# Patient Record
Sex: Female | Born: 1990 | Hispanic: Yes | Marital: Married | Smoking: Never smoker
Health system: Southern US, Community
[De-identification: ages and names within clinical notes are randomized; demographics above are authoritative.]

## PROBLEM LIST (undated history)

## (undated) ENCOUNTER — Inpatient Hospital Stay (HOSPITAL_COMMUNITY): Payer: Self-pay

## (undated) DIAGNOSIS — Z789 Other specified health status: Secondary | ICD-10-CM

## (undated) DIAGNOSIS — R51 Headache: Secondary | ICD-10-CM

## (undated) HISTORY — PX: DILATION AND CURETTAGE OF UTERUS: SHX78

## (undated) HISTORY — DX: Headache: R51

## (undated) HISTORY — PX: OTHER SURGICAL HISTORY: SHX169

---
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
## 2013-03-21 ENCOUNTER — Ambulatory Visit (HOSPITAL_COMMUNITY): Payer: BC Managed Care – PPO | Admitting: Anesthesiology

## 2013-03-21 ENCOUNTER — Encounter (HOSPITAL_COMMUNITY)
Admission: RE | Disposition: A | Discharge status: Home / Self Care | Payer: Self-pay | Source: Ambulatory Visit | Attending: Obstetrics & Gynecology

## 2013-03-21 ENCOUNTER — Other Ambulatory Visit: Payer: Self-pay | Admitting: Obstetrics & Gynecology

## 2013-03-21 ENCOUNTER — Ambulatory Visit (HOSPITAL_COMMUNITY)
Admission: RE | Admit: 2013-03-21 | Discharge: 2013-03-21 | Disposition: A | Discharge status: Home / Self Care | Payer: BC Managed Care – PPO | Source: Ambulatory Visit | Attending: Obstetrics & Gynecology | Admitting: Obstetrics & Gynecology

## 2013-03-21 ENCOUNTER — Encounter (HOSPITAL_COMMUNITY): Payer: Self-pay | Admitting: Anesthesiology

## 2013-03-21 ENCOUNTER — Encounter (HOSPITAL_COMMUNITY): Payer: Self-pay | Admitting: *Deleted

## 2013-03-21 DIAGNOSIS — O021 Missed abortion: Secondary | ICD-10-CM

## 2013-03-21 HISTORY — PX: DILATION AND EVACUATION: SHX1459

## 2013-03-21 HISTORY — DX: Other specified health status: Z78.9

## 2013-03-21 LAB — CBC
MCH: 30.1 pg (ref 26.0–34.0)
MCHC: 35.1 g/dL (ref 30.0–36.0)
Platelets: 195 10*3/uL (ref 150–400)

## 2013-03-21 SURGERY — DILATION AND EVACUATION, UTERUS
Anesthesia: Choice | Wound class: Clean Contaminated

## 2013-03-21 MED ORDER — CHLOROPROCAINE HCL 1 % IJ SOLN
INTRAMUSCULAR | Status: DC | PRN
  Administered 2013-03-21: 20 mL

## 2013-03-21 MED ORDER — MIDAZOLAM HCL 2 MG/2ML IJ SOLN
INTRAMUSCULAR | Status: AC
  Filled 2013-03-21: qty 2

## 2013-03-21 MED ORDER — PROMETHAZINE HCL 25 MG/ML IJ SOLN: 6.2500 mg | INTRAMUSCULAR | Status: DC | PRN

## 2013-03-21 MED ORDER — PROPOFOL 10 MG/ML IV EMUL
INTRAVENOUS | Status: DC | PRN
  Administered 2013-03-21 (×4): 20 mg via INTRAVENOUS
  Administered 2013-03-21: 40 mg via INTRAVENOUS
  Administered 2013-03-21: 20 mg via INTRAVENOUS

## 2013-03-21 MED ORDER — LIDOCAINE HCL (CARDIAC) 20 MG/ML IV SOLN
INTRAVENOUS | Status: AC
  Filled 2013-03-21: qty 5

## 2013-03-21 MED ORDER — LIDOCAINE HCL (CARDIAC) 20 MG/ML IV SOLN
INTRAVENOUS | Status: DC | PRN
  Administered 2013-03-21 (×2): 20 mg via INTRAVENOUS

## 2013-03-21 MED ORDER — KETOROLAC TROMETHAMINE 30 MG/ML IJ SOLN: 15.0000 mg | Freq: Once | INTRAMUSCULAR | Status: AC | PRN

## 2013-03-21 MED ORDER — ONDANSETRON HCL 4 MG/2ML IJ SOLN
INTRAMUSCULAR | Status: AC
  Filled 2013-03-21: qty 2

## 2013-03-21 MED ORDER — FENTANYL CITRATE 0.05 MG/ML IJ SOLN: 25.0000 ug | INTRAMUSCULAR | Status: DC | PRN

## 2013-03-21 MED ORDER — FENTANYL CITRATE 0.05 MG/ML IJ SOLN
INTRAMUSCULAR | Status: DC | PRN
  Administered 2013-03-21: 50 ug via INTRAVENOUS

## 2013-03-21 MED ORDER — MEPERIDINE HCL 25 MG/ML IJ SOLN: 6.2500 mg | INTRAMUSCULAR | Status: DC | PRN

## 2013-03-21 MED ORDER — LACTATED RINGERS IV SOLN
INTRAVENOUS | Status: DC
  Administered 2013-03-21: 13:00:00 via INTRAVENOUS

## 2013-03-21 MED ORDER — METHYLERGONOVINE MALEATE 0.2 MG/ML IJ SOLN
INTRAMUSCULAR | Status: AC
  Filled 2013-03-21: qty 1

## 2013-03-21 MED ORDER — IBUPROFEN 200 MG PO TABS: 600.0000 mg | ORAL_TABLET | Freq: Three times a day (TID) | ORAL | Status: DC | PRN

## 2013-03-21 MED ORDER — KETOROLAC TROMETHAMINE 30 MG/ML IJ SOLN
INTRAMUSCULAR | Status: AC
  Filled 2013-03-21: qty 1

## 2013-03-21 MED ORDER — DOXYCYCLINE HYCLATE 100 MG IV SOLR
100.0000 mg | Freq: Once | INTRAVENOUS | Status: AC
Start: 2013-03-21 — End: 2013-03-21
  Administered 2013-03-21: 100 mg via INTRAVENOUS
  Filled 2013-03-21: qty 100

## 2013-03-21 MED ORDER — CHLOROPROCAINE HCL 1 % IJ SOLN
INTRAMUSCULAR | Status: AC
  Filled 2013-03-21: qty 30

## 2013-03-21 MED ORDER — PROPOFOL 10 MG/ML IV EMUL
INTRAVENOUS | Status: AC
  Filled 2013-03-21: qty 20

## 2013-03-21 MED ORDER — METHYLERGONOVINE MALEATE 0.2 MG/ML IJ SOLN
INTRAMUSCULAR | Status: DC | PRN
  Administered 2013-03-21: 0.2 mg via INTRAMUSCULAR

## 2013-03-21 MED ORDER — MIDAZOLAM HCL 5 MG/5ML IJ SOLN
INTRAMUSCULAR | Status: DC | PRN
  Administered 2013-03-21: 2 mg via INTRAVENOUS

## 2013-03-21 MED ORDER — MIDAZOLAM HCL 2 MG/2ML IJ SOLN: 0.5000 mg | Freq: Once | INTRAMUSCULAR | Status: AC | PRN

## 2013-03-21 MED ORDER — FENTANYL CITRATE 0.05 MG/ML IJ SOLN
INTRAMUSCULAR | Status: AC
  Filled 2013-03-21: qty 2

## 2013-03-21 SURGICAL SUPPLY — 20 items
CATH ROBINSON RED A/P 16FR (CATHETERS) ×2 IMPLANT
CLOTH BEACON ORANGE TIMEOUT ST (SAFETY) ×2 IMPLANT
DECANTER SPIKE VIAL GLASS SM (MISCELLANEOUS) ×2 IMPLANT
GLOVE BIO SURGEON STRL SZ7 (GLOVE) ×2 IMPLANT
GLOVE BIOGEL PI IND STRL 7.0 (GLOVE) ×1 IMPLANT
GLOVE BIOGEL PI INDICATOR 7.0 (GLOVE) ×1
GOWN STRL REIN XL XLG (GOWN DISPOSABLE) ×4 IMPLANT
KIT BERKELEY 1ST TRIMESTER 3/8 (MISCELLANEOUS) ×2 IMPLANT
NEEDLE SPNL 22GX3.5 QUINCKE BK (NEEDLE) ×2 IMPLANT
NS IRRIG 1000ML POUR BTL (IV SOLUTION) ×2 IMPLANT
PACK VAGINAL MINOR WOMEN LF (CUSTOM PROCEDURE TRAY) ×2 IMPLANT
PAD OB MATERNITY 4.3X12.25 (PERSONAL CARE ITEMS) ×2 IMPLANT
PAD PREP 24X48 CUFFED NSTRL (MISCELLANEOUS) ×2 IMPLANT
SET BERKELEY SUCTION TUBING (SUCTIONS) ×2 IMPLANT
SYR CONTROL 10ML LL (SYRINGE) ×2 IMPLANT
TOWEL OR 17X24 6PK STRL BLUE (TOWEL DISPOSABLE) ×4 IMPLANT
VACURETTE 10 RIGID CVD (CANNULA) IMPLANT
VACURETTE 7MM CVD STRL WRAP (CANNULA) IMPLANT
VACURETTE 8 RIGID CVD (CANNULA) IMPLANT
VACURETTE 9 RIGID CVD (CANNULA) IMPLANT
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
## 2013-03-21 NOTE — Anesthesia Preprocedure Evaluation (Signed)
Anesthesia Evaluation  Patient identified by MRN, date of birth, ID band Patient awake    Reviewed: Allergy & Precautions, H&P , Patient's Chart, lab work & pertinent test results, reviewed documented beta blocker date and time   History of Anesthesia Complications Negative for: history of anesthetic complications  Airway Mallampati: II TM Distance: >3 FB Neck ROM: full    Dental no notable dental hx.    Pulmonary neg pulmonary ROS,  breath sounds clear to auscultation  Pulmonary exam normal       Cardiovascular Exercise Tolerance: Good negative cardio ROS  Rhythm:regular Rate:Normal     Neuro/Psych negative neurological ROS  negative psych ROS   GI/Hepatic negative GI ROS, Neg liver ROS,   Endo/Other  negative endocrine ROS  Renal/GU negative Renal ROS     Musculoskeletal   Abdominal   Peds  Hematology negative hematology ROS (+)   Anesthesia Other Findings   Reproductive/Obstetrics negative OB ROS                           Anesthesia Physical Anesthesia Plan  ASA: II  Anesthesia Plan: MAC   Post-op Pain Management:    Induction:   Airway Management Planned:   Additional Equipment:   Intra-op Plan:   Post-operative Plan:   Informed Consent: I have reviewed the patients History and Physical, chart, labs and discussed the procedure including the risks, benefits and alternatives for the proposed anesthesia with the patient or authorized representative who has indicated his/her understanding and acceptance.   Dental Advisory Given  Plan Discussed with: CRNA, Surgeon and Anesthesiologist  Anesthesia Plan Comments:         Anesthesia Quick Evaluation  

## 2013-03-21 NOTE — Transfer of Care (Signed)
Immediate Anesthesia Transfer of Care Note  Patient: Cindy Bryant  Procedure(s) Performed: Procedure(s): DILATATION AND EVACUATION (N/A)  Patient Location: PACU  Anesthesia Type:MAC  Level of Consciousness: awake, alert  and oriented  Airway & Oxygen Therapy: Patient Spontanous Breathing  Post-op Assessment: Report given to PACU RN, Post -op Vital signs reviewed and stable and Patient moving all extremities  Post vital signs: Reviewed and stable  Complications: No apparent anesthesia complications

## 2013-03-21 NOTE — Op Note (Signed)
Preoperative diagnosis: 11 wks Missed abortion Postop diagnosis: as above.  Procedure: Suction D&E Anesthesia General MAC, paracervical block Surgeon: Shea Evans, MD Assistant: none  IV fluids: 800 cc LR Estimated blood loss : 50 cc Urine output: no cath Complications none Condition stable Disposition PACU  Specimen: products of conception sent to pathology.  Procedure  Indication: 11 wks pregnancy, noted absent fetal heart beat on office sonogram on 03/20/13 and measured 9 wks. Decision was made to proceed with suction D&E. Risks/ complications of surgery including infection, bleeding, damage to internal organs including uterine perforation, Asherman syndrome were reviewed. She voiced understanding and gave informed written consent.  Patient was brought to the operating room with IV running. She received preop 100mg  IV Doxycycline. She underwent general MAC anesthesia without complications. She was given dorsolithotomy position. Parts were prepped and draped in standard fashion.  Bimanual exam revealed uterus to be anteverted and 10 week size.  Speculum was placed and cervix was grasped with single-tooth tenaculum. Paracervical block with 20 cc 1% Nesacaine given.  Cervical os was dilated to 27 [place] dilator. A # 9 suction curette was introduced in the uterine cavity and suction evacuation of products of conception was done in multiple step wise fashion. Gentle sharp curettage was performed. Suction cannula was reintroduced and cavity was felt to be empty. The bleeding was controlled well and uterus was firm on exam.  At this point the procedure was complete. All instruments removed. All counts are correct x2. Specimen products of conception. No complications. Patient was made supine dorsal anesthesia and brought to the recovery room in stable condition.  Patient will be discharged home today.  Dc follow up in 2 weeks in office. Post op care and warning signs of infection and excessive  bleeding reviewed.  V.Jakobe Blau, MD.

## 2013-03-22 ENCOUNTER — Encounter (HOSPITAL_COMMUNITY): Payer: Self-pay | Admitting: Obstetrics & Gynecology

## 2013-03-22 NOTE — Anesthesia Postprocedure Evaluation (Signed)
  Anesthesia Post Note  Patient: Cindy Bryant  Procedure(s) Performed: Procedure(s) (LRB): DILATATION AND EVACUATION (N/A)  Anesthesia type: MAC  Patient location: PACU  Post pain: Pain level controlled  Post assessment: Post-op Vital signs reviewed  Last Vitals:  Filed Vitals:   03/21/13 1515  BP: 106/72  Pulse: 85  Temp:   Resp: 21    Post vital signs: Reviewed  Level of consciousness: sedated  Complications: No apparent anesthesia complications

## 2013-08-18 ENCOUNTER — Inpatient Hospital Stay (HOSPITAL_COMMUNITY): Payer: BC Managed Care – PPO

## 2013-08-18 ENCOUNTER — Inpatient Hospital Stay (HOSPITAL_COMMUNITY)
Admission: AD | Admit: 2013-08-18 | Discharge: 2013-08-18 | Disposition: A | Discharge status: Home / Self Care | Payer: BC Managed Care – PPO | Source: Ambulatory Visit | Attending: Obstetrics and Gynecology | Admitting: Obstetrics and Gynecology

## 2013-08-18 ENCOUNTER — Encounter (HOSPITAL_COMMUNITY): Payer: Self-pay | Admitting: *Deleted

## 2013-08-18 DIAGNOSIS — O200 Threatened abortion: Secondary | ICD-10-CM
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
## 2013-08-18 NOTE — MAU Note (Signed)
Pt 6.3wks G2 P0, PNC with Dr. Juliene Pina.  Having bright red vaginal bleeding this evening.  Small amt of RLQ cramping. U/S on Wednesday-no problems noted.

## 2013-08-18 NOTE — MAU Note (Addendum)
Pt. Stood up this evening and felt some discharge that was small amount of darker red bleeding that was mucousy. Has had RLQ pain throughout her pregnancy and is still experiencing this today. No cramping. Denies any strenuous activity today. Ultrasound done in office on Wednesday and everything looked fine per pt. Pt. Is wearing a pad right now scant amount of bleeding noted on pad. Did have intercourse yesterday.

## 2013-09-12 NOTE — L&D Delivery Note (Signed)
Delivery Note At 11:37 PM a viable and healthy female was delivered via Vaginal, Spontaneous Delivery (Presentation: Left Occiput Anterior).  APGAR: 9, 9; weight pending.   Placenta status: , Manual removal, inspected, complete.  Cord:  with the following complications: None.  Cord pH: na Uterine atony noted. Bimanual massage and Methergine given IM .2mg .  Anesthesia: Epidural  Episiotomy: None Lacerations: 2nd degree;Perineal Suture Repair: 2.0 3.0 vicryl rapide Est. Blood Loss (mL): 400  Mom to postpartum.  Baby to Couplet care / Skin to Skin.  Nahomy Limburg J 03/31/2014, 12:06 AM

## 2013-09-17 ENCOUNTER — Ambulatory Visit: Payer: BC Managed Care – PPO | Admitting: Cardiology

## 2013-09-25 LAB — OB RESULTS CONSOLE RUBELLA ANTIBODY, IGM: Rubella: IMMUNE

## 2013-09-25 LAB — OB RESULTS CONSOLE ANTIBODY SCREEN: Antibody Screen: NEGATIVE

## 2013-09-25 LAB — OB RESULTS CONSOLE ABO/RH: RH TYPE: POSITIVE

## 2013-09-25 LAB — OB RESULTS CONSOLE HIV ANTIBODY (ROUTINE TESTING): HIV: NONREACTIVE

## 2013-09-25 LAB — OB RESULTS CONSOLE HEPATITIS B SURFACE ANTIGEN: Hepatitis B Surface Ag: NEGATIVE

## 2013-09-25 LAB — OB RESULTS CONSOLE RPR: RPR: NONREACTIVE

## 2013-09-27 LAB — OB RESULTS CONSOLE GC/CHLAMYDIA
CHLAMYDIA, DNA PROBE: NEGATIVE
Gonorrhea: NEGATIVE

## 2013-10-02 ENCOUNTER — Encounter (INDEPENDENT_AMBULATORY_CARE_PROVIDER_SITE_OTHER): Payer: Self-pay

## 2013-10-02 ENCOUNTER — Encounter: Payer: Self-pay | Admitting: General Surgery

## 2013-10-02 ENCOUNTER — Encounter: Payer: Self-pay | Admitting: Cardiology

## 2013-10-02 ENCOUNTER — Ambulatory Visit (INDEPENDENT_AMBULATORY_CARE_PROVIDER_SITE_OTHER): Payer: BC Managed Care – PPO | Admitting: Cardiology

## 2013-10-02 VITALS — BP 102/62 | HR 105 | Ht 67.0 in | Wt 118.0 lb

## 2013-10-02 DIAGNOSIS — R002 Palpitations: Secondary | ICD-10-CM | POA: Insufficient documentation

## 2013-10-02 DIAGNOSIS — R011 Cardiac murmur, unspecified: Secondary | ICD-10-CM | POA: Insufficient documentation

## 2013-10-02 NOTE — Patient Instructions (Addendum)
Your physician recommends that you continue on your current medications as directed. Please refer to the Current Medication list given to you today.  Your physician has requested that you have an echocardiogram. Echocardiography is a painless test that uses sound waves to create images of your heart. It provides your doctor with information about the size and shape of your heart and how well your heart's chambers and valves are working. This procedure takes approximately one hour. There are no restrictions for this procedure.  Your physician has recommended that you wear a holter monitor. Holter monitors are medical devices that record the heart's electrical activity. Doctors most often use these monitors to diagnose arrhythmias. Arrhythmias are problems with the speed or rhythm of the heartbeat. The monitor is a small, portable device. You can wear one while you do your normal daily activities. This is usually used to diagnose what is causing palpitations/syncope (passing out).  Your physician has recommended that you wear an event monitor. Event monitors are medical devices that record the heart's electrical activity. Doctors most often us these monitors to diagnose arrhythmias. Arrhythmias are problems with the speed or rhythm of the heartbeat. The monitor is a small, portable device. You can wear one while you do your normal daily activities. This is usually used to diagnose what is causing palpitations/syncope (passing out).  Your physician recommends that you schedule a follow-up appointment As Needed

## 2013-10-02 NOTE — Progress Notes (Signed)
  [address] [place], [place]  [address] Phone: [phone]) [phone] Fax:  [phone]  Date:  10/02/2013   ID:  Cindy SnookMelissa Bryant, DOB December 15, 1990, MRN [id number]  PCP:  Dr. [facility]Mody with [facility]  Cardiologist:  Armanda Magicraci Yovanni Frenette, MD     History of Present Illness: Cindy Bryant is a 23 y.o. female with no prior cardiac history who presents today for evaluation of palpitations and heart mur.  She recently told her MD that when she lays down she will feel her heart beating fast.  She notices this a few times weekly.  She is [redacted] weeks pregnant.  She started having palpitations last summer around the time she had a miscarriage.  After the miscarriage her palpitations did not resolve.  She was also told that she has a heart murmur.  She denies any chest pain but sometimes feels like she has to take a deep breath in.  She denies any LE edema, dizziness or syncope.  Wt Readings from Last 3 Encounters:  10/02/13 118 lb (53.524 kg)  08/18/13 112 lb (50.803 kg)  03/21/13 114 lb (51.71 kg) (22%*, Z = -0.78)   * Growth percentiles are based on CDC 2-20 Years data.     Past Medical History  Diagnosis Date  . Medical history non-contributory   . Palpitations     Current Outpatient Prescriptions  Medication Sig Dispense Refill  . Doxylamine-Pyridoxine (DICLEGIS PO) Take 2 tablets by mouth as needed (nausea).      . Prenatal Vit-Fe Fumarate-FA (PRENATAL MULTIVITAMIN) TABS Take 1 tablet by mouth daily at 12 noon.      . progesterone (PROMETRIUM) 200 MG capsule Take 200 mg by mouth 2 (two) times daily.       No current facility-administered medications for this visit.    Allergies:   No Known Allergies  Social History:  The patient  reports that she has never smoked. She does not have any smokeless tobacco history on file. She reports that she does not drink alcohol or use illicit drugs.   Family History:  The patient's family history includes Hypertension in her maternal grandmother and  mother.   ROS:  Please see the history of present illness.      All other systems reviewed and negative.   PHYSICAL EXAM: VS:  BP 102/62  Pulse 105  Ht 5\' 7"  (1.702 m)  Wt 118 lb (53.524 kg)  BMI 18.48 kg/m2  LMP 06/26/2013 Well nourished, well developed, in no acute distress HEENT: normal Neck: no JVD Cardiac:  normal S1, S2; RRR; 2/6 SM at LUSB to LLSB Lungs:  clear to auscultation bilaterally, no wheezing, rhonchi or rales Abd: soft, nontender, no hepatomegaly Ext: no edema Skin: warm and dry Neuro:  CNs 2-12 intact, no focal abnormalities noted  EKG:  NSR to sinus tachycardia  with no ST changes HR 100-105bpm     ASSESSMENT AND PLAN:  1. Palpitations  - I will get a 24 hour Holter to assess average HR over 24 hour period of time and also an event monitor to assess for arrhythmias 2. Heart mumur  - 2D echo to assess  Followup with me PRN  Signed, Armanda Magicraci Tashira Torre, MD 10/02/2013 12:04 PM

## 2013-10-04 ENCOUNTER — Ambulatory Visit (HOSPITAL_COMMUNITY): Payer: BC Managed Care – PPO | Attending: Cardiology | Admitting: Radiology

## 2013-10-04 ENCOUNTER — Encounter: Payer: Self-pay | Admitting: Cardiology

## 2013-10-04 ENCOUNTER — Encounter (INDEPENDENT_AMBULATORY_CARE_PROVIDER_SITE_OTHER): Payer: BC Managed Care – PPO

## 2013-10-04 ENCOUNTER — Encounter: Payer: Self-pay | Admitting: Radiology

## 2013-10-04 DIAGNOSIS — O99419 Diseases of the circulatory system complicating pregnancy, unspecified trimester: Principal | ICD-10-CM

## 2013-10-04 DIAGNOSIS — R002 Palpitations: Secondary | ICD-10-CM

## 2013-10-04 DIAGNOSIS — I059 Rheumatic mitral valve disease, unspecified: Secondary | ICD-10-CM | POA: Insufficient documentation

## 2013-10-04 DIAGNOSIS — R011 Cardiac murmur, unspecified: Secondary | ICD-10-CM

## 2013-10-04 DIAGNOSIS — I2510 Atherosclerotic heart disease of native coronary artery without angina pectoris: Secondary | ICD-10-CM | POA: Insufficient documentation

## 2013-10-04 NOTE — Progress Notes (Signed)
Patient ID: Cindy Bryant, female   DOB: Feb 07, 1991, 23 y.o.   MRN: [id number] E CARDIO 24 HR HOLTER MONITOR APPLIED

## 2013-10-04 NOTE — Progress Notes (Signed)
Echocardiogram performed.  

## 2013-10-07 ENCOUNTER — Encounter (INDEPENDENT_AMBULATORY_CARE_PROVIDER_SITE_OTHER): Payer: BC Managed Care – PPO

## 2013-10-07 ENCOUNTER — Encounter: Payer: Self-pay | Admitting: Radiology

## 2013-10-07 DIAGNOSIS — R002 Palpitations: Secondary | ICD-10-CM

## 2013-10-07 NOTE — Progress Notes (Signed)
Patient ID: Cindy SnookMelissa Bryant, female   DOB: 05-14-1991, 23 y.o.   MRN: [id number] LIfewatch 30 day monitor applied

## 2013-10-09 ENCOUNTER — Telehealth: Payer: Self-pay | Admitting: Cardiology

## 2013-10-09 NOTE — Telephone Encounter (Signed)
Please let patient know that heart monitor showed normal sinus rhythm with sinus tachycardia up to 103bpm which is normal for pregnancy and occasional extra heart beats from the bottom of the heart which are benign

## 2013-10-09 NOTE — Telephone Encounter (Signed)
Pt is aware.  

## 2013-11-13 ENCOUNTER — Telehealth: Payer: Self-pay | Admitting: Cardiology

## 2013-11-13 DIAGNOSIS — R000 Tachycardia, unspecified: Secondary | ICD-10-CM

## 2013-11-13 NOTE — Telephone Encounter (Signed)
Please let patient know that heart monitor showed NSR with sinus tachycardia up to 132 bpm which corresponded with palpitations. Most of the time she was tachycardic.  Please order a 24 hour Holter monitor to assess for average HR during 24 hour period

## 2013-11-13 NOTE — Telephone Encounter (Signed)
Pt is aware and holter ordered

## 2013-11-13 NOTE — Addendum Note (Signed)
Addended by: Nita SellsORSON, Bentley Fissel L on: 11/13/2013 12:31 PM   Modules accepted: Orders

## 2013-11-19 ENCOUNTER — Encounter (INDEPENDENT_AMBULATORY_CARE_PROVIDER_SITE_OTHER): Payer: Self-pay

## 2013-11-19 ENCOUNTER — Encounter: Payer: Self-pay | Admitting: *Deleted

## 2013-11-19 DIAGNOSIS — R000 Tachycardia, unspecified: Secondary | ICD-10-CM

## 2013-11-19 NOTE — Progress Notes (Unsigned)
Patient ID: Cindy SnookMelissa Ailey, female   DOB: 12-23-1990, 23 y.o.   MRN: [id number] E-Cardio 24 hour holter monitor applied to patient.

## 2013-11-26 ENCOUNTER — Telehealth: Payer: Self-pay | Admitting: Cardiology

## 2013-11-26 NOTE — Telephone Encounter (Signed)
Pt.notified

## 2013-11-26 NOTE — Telephone Encounter (Signed)
Please let patient know that heart monitor showed NSR with sinus tachycardia up to 155bpm.  Average HR 101bpm.  Occasional PVC's

## 2014-01-07 ENCOUNTER — Encounter (HOSPITAL_COMMUNITY): Payer: Self-pay

## 2014-01-07 ENCOUNTER — Inpatient Hospital Stay (HOSPITAL_COMMUNITY)
Admission: AD | Admit: 2014-01-07 | Discharge: 2014-01-08 | Disposition: A | Discharge status: Home / Self Care | Payer: BC Managed Care – PPO | Source: Ambulatory Visit | Attending: Obstetrics & Gynecology | Admitting: Obstetrics & Gynecology

## 2014-01-07 DIAGNOSIS — R109 Unspecified abdominal pain: Secondary | ICD-10-CM | POA: Insufficient documentation

## 2014-01-07 DIAGNOSIS — O9989 Other specified diseases and conditions complicating pregnancy, childbirth and the puerperium: Principal | ICD-10-CM

## 2014-01-07 DIAGNOSIS — N949 Unspecified condition associated with female genital organs and menstrual cycle: Secondary | ICD-10-CM

## 2014-01-07 DIAGNOSIS — O99891 Other specified diseases and conditions complicating pregnancy: Secondary | ICD-10-CM | POA: Insufficient documentation

## 2014-01-07 LAB — URINE MICROSCOPIC-ADD ON

## 2014-01-07 LAB — URINALYSIS, ROUTINE W REFLEX MICROSCOPIC
Bilirubin Urine: NEGATIVE
Glucose, UA: NEGATIVE mg/dL
HGB URINE DIPSTICK: NEGATIVE
Ketones, ur: NEGATIVE mg/dL
NITRITE: NEGATIVE
PROTEIN: NEGATIVE mg/dL
UROBILINOGEN UA: 0.2 mg/dL (ref 0.0–1.0)
pH: 5.5 (ref 5.0–8.0)

## 2014-01-07 NOTE — MAU Note (Signed)
Pt states at 1045pm she got out of the shower and noticed a large gush of fluid. States she has had some upper abdominal tightening and pain. Denies vaginal bleeding. +FM.

## 2014-01-08 LAB — AMNISURE RUPTURE OF MEMBRANE (ROM) NOT AT ARMC: AMNISURE: NEGATIVE

## 2014-01-08 LAB — POCT FERN TEST: POCT Fern Test: NEGATIVE

## 2014-01-08 MED ORDER — ACETAMINOPHEN 500 MG PO TABS
1000.0000 mg | ORAL_TABLET | Freq: Once | ORAL | Status: AC
  Administered 2014-01-08: 1000 mg via ORAL
  Filled 2014-01-08: qty 2

## 2014-01-08 NOTE — Discharge Instructions (Signed)
Abdominal Pain During Pregnancy  Belly (abdominal) pain is common during pregnancy. Most of the time, it is not a serious problem. Other times, it can be a sign that something is wrong with the pregnancy. Always tell your doctor if you have belly pain.  HOME CARE  Monitor your belly pain for any changes. The following actions may help you feel better:  · Do not have sex (intercourse) or put anything in your vagina until you feel better.  · Rest until your pain stops.  · Drink clear fluids if you feel sick to your stomach (nauseous). Do not eat solid food until you feel better.  · Only take medicine as told by your doctor.  · Keep all doctor visits as told.  GET HELP RIGHT AWAY IF:   · You are bleeding, leaking fluid, or pieces of tissue come out of your vagina.  · You have more pain or cramping.  · You keep throwing up (vomiting).  · You have pain when you pee (urinate) or have blood in your pee.  · You have a fever.  · You do not feel your baby moving as much.  · You feel very weak or feel like passing out.  · You have trouble breathing, with or without belly pain.  · You have a very bad headache and belly pain.  · You have fluid leaking from your vagina and belly pain.  · You keep having watery poop (diarrhea).  · Your belly pain does not go away after resting, or the pain gets worse.  MAKE SURE YOU:   · Understand these instructions.  · Will watch your condition.  · Will get help right away if you are not doing well or get worse.  Document Released: 08/17/2009 Document Revised: 05/01/2013 Document Reviewed: 03/28/2013  [employer]® Patient Information ©2014 [employer].
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
## 2014-03-05 LAB — OB RESULTS CONSOLE GBS: GBS: NEGATIVE

## 2014-03-26 ENCOUNTER — Encounter (HOSPITAL_COMMUNITY): Payer: Self-pay | Admitting: *Deleted

## 2014-03-26 ENCOUNTER — Inpatient Hospital Stay (HOSPITAL_COMMUNITY)
Admission: AD | Admit: 2014-03-26 | Discharge: 2014-03-26 | Disposition: A | Discharge status: Home / Self Care | Payer: BC Managed Care – PPO | Source: Ambulatory Visit | Attending: Obstetrics and Gynecology | Admitting: Obstetrics and Gynecology

## 2014-03-26 DIAGNOSIS — O479 False labor, unspecified: Secondary | ICD-10-CM | POA: Insufficient documentation

## 2014-03-26 NOTE — MAU Note (Signed)
Pt seen in office today-membranes stripped. Contractions started about 1-2 hours ago every 5 mins. Denies LOF. Has brown discharge. +FM.

## 2014-03-26 NOTE — Discharge Instructions (Signed)
Third Trimester of Pregnancy °The third trimester is from week 29 through week 42, months 7 through 9. The third trimester is a time when the fetus is growing rapidly. At the end of the ninth month, the fetus is about 20 inches in length and weighs 6-10 pounds.  °BODY CHANGES °Your body goes through many changes during pregnancy. The changes vary from woman to woman.  °· Your weight will continue to increase. You can expect to gain 25-35 pounds (11-16 kg) by the end of the pregnancy. °· You may begin to get stretch marks on your hips, abdomen, and breasts. °· You may urinate more often because the fetus is moving lower into your pelvis and pressing on your bladder. °· You may develop or continue to have heartburn as a result of your pregnancy. °· You may develop constipation because certain hormones are causing the muscles that push waste through your intestines to slow down. °· You may develop hemorrhoids or swollen, bulging veins (varicose veins). °· You may have pelvic pain because of the weight gain and pregnancy hormones relaxing your joints between the bones in your pelvis. Backaches may result from overexertion of the muscles supporting your posture. °· You may have changes in your hair. These can include thickening of your hair, rapid growth, and changes in texture. Some women also have hair loss during or after pregnancy, or hair that feels dry or thin. Your hair will most likely return to normal after your baby is born. °· Your breasts will continue to grow and be tender. A yellow discharge may leak from your breasts called colostrum. °· Your belly button may stick out. °· You may feel short of breath because of your expanding uterus. °· You may notice the fetus "dropping," or moving lower in your abdomen. °· You may have a bloody mucus discharge. This usually occurs a few days to a week before labor begins. °· Your cervix becomes thin and soft (effaced) near your due date. °WHAT TO EXPECT AT YOUR PRENATAL  EXAMS  °You will have prenatal exams every 2 weeks until week 36. Then, you will have weekly prenatal exams. During a routine prenatal visit: °· You will be weighed to make sure you and the fetus are growing normally. °· Your blood pressure is taken. °· Your abdomen will be measured to track your baby's growth. °· The fetal heartbeat will be listened to. °· Any test results from the previous visit will be discussed. °· You may have a cervical check near your due date to see if you have effaced. °At around 36 weeks, your caregiver will check your cervix. At the same time, your caregiver will also perform a test on the secretions of the vaginal tissue. This test is to determine if a type of bacteria, Group B streptococcus, is present. Your caregiver will explain this further. °Your caregiver may ask you: °· What your birth plan is. °· How you are feeling. °· If you are feeling the baby move. °· If you have had any abnormal symptoms, such as leaking fluid, bleeding, severe headaches, or abdominal cramping. °· If you have any questions. °Other tests or screenings that may be performed during your third trimester include: °· Blood tests that check for low iron levels (anemia). °· Fetal testing to check the health, activity level, and growth of the fetus. Testing is done if you have certain medical conditions or if there are problems during the pregnancy. °FALSE LABOR °You may feel small, irregular contractions that   eventually go away. These are called Braxton Hicks contractions, or false labor. Contractions may last for hours, days, or even weeks before true labor sets in. If contractions come at regular intervals, intensify, or become painful, it is best to be seen by your caregiver.  °SIGNS OF LABOR  °· Menstrual-like cramps. °· Contractions that are 5 minutes apart or less. °· Contractions that start on the top of the uterus and spread down to the lower abdomen and back. °· A sense of increased pelvic pressure or back  pain. °· A watery or bloody mucus discharge that comes from the vagina. °If you have any of these signs before the 37th week of pregnancy, call your caregiver right away. You need to go to the hospital to get checked immediately. °HOME CARE INSTRUCTIONS  °· Avoid all smoking, herbs, alcohol, and unprescribed drugs. These chemicals affect the formation and growth of the baby. °· Follow your caregiver's instructions regarding medicine use. There are medicines that are either safe or unsafe to take during pregnancy. °· Exercise only as directed by your caregiver. Experiencing uterine cramps is a good sign to stop exercising. °· Continue to eat regular, healthy meals. °· Wear a good support bra for breast tenderness. °· Do not use hot tubs, steam rooms, or saunas. °· Wear your seat belt at all times when driving. °· Avoid raw meat, uncooked cheese, cat litter boxes, and soil used by cats. These carry germs that can cause birth defects in the baby. °· Take your prenatal vitamins. °· Try taking a stool softener (if your caregiver approves) if you develop constipation. Eat more high-fiber foods, such as fresh vegetables or fruit and whole grains. Drink plenty of fluids to keep your urine clear or pale yellow. °· Take warm sitz baths to soothe any pain or discomfort caused by hemorrhoids. Use hemorrhoid cream if your caregiver approves. °· If you develop varicose veins, wear support hose. Elevate your feet for 15 minutes, 3-4 times a day. Limit salt in your diet. °· Avoid heavy lifting, wear low heal shoes, and practice good posture. °· Rest a lot with your legs elevated if you have leg cramps or low back pain. °· Visit your dentist if you have not gone during your pregnancy. Use a soft toothbrush to brush your teeth and be gentle when you floss. °· A sexual relationship may be continued unless your caregiver directs you otherwise. °· Do not travel far distances unless it is absolutely necessary and only with the approval  of your caregiver. °· Take prenatal classes to understand, practice, and ask questions about the labor and delivery. °· Make a trial run to the hospital. °· Pack your hospital bag. °· Prepare the baby's nursery. °· Continue to go to all your prenatal visits as directed by your caregiver. °SEEK MEDICAL CARE IF: °· You are unsure if you are in labor or if your water has broken. °· You have dizziness. °· You have mild pelvic cramps, pelvic pressure, or nagging pain in your abdominal area. °· You have persistent nausea, vomiting, or diarrhea. °· You have a bad smelling vaginal discharge. °· You have pain with urination. °SEEK IMMEDIATE MEDICAL CARE IF:  °· You have a fever. °· You are leaking fluid from your vagina. °· You have spotting or bleeding from your vagina. °· You have severe abdominal cramping or pain. °· You have rapid weight loss or gain. °· You have shortness of breath with chest pain. °· You notice sudden or extreme swelling   of your face, hands, ankles, feet, or legs. °· You have not felt your baby move in over an hour. °· You have severe headaches that do not go away with medicine. °· You have vision changes. °Document Released: 08/23/2001 Document Revised: 09/03/2013 Document Reviewed: 10/30/2012 °[employer]® Patient Information ©2015 [employer]. This information is not intended to replace advice given to you by your health care provider. Make sure you discuss any questions you have with your health care provider. °Fetal Movement Counts °Patient Name: __________________________________________________ Patient Due Date: ____________________ °Performing a fetal movement count is highly recommended in high-risk pregnancies, but it is good for every pregnant woman to do. Your caregiver may ask you to start counting fetal movements at 28 weeks of the pregnancy. Fetal movements often increase: °· After eating a full meal. °· After physical activity. °· After eating or drinking something sweet or cold. °· At  rest. °Pay attention to when you feel the baby is most active. This will help you notice a pattern of your baby's sleep and wake cycles and what factors contribute to an increase in fetal movement. It is important to perform a fetal movement count at the same time each day when your baby is normally most active.  °HOW TO COUNT FETAL MOVEMENTS °1. Find a quiet and comfortable area to sit or lie down on your left side. Lying on your left side provides the best blood and oxygen circulation to your baby. °2. Write down the day and time on a sheet of paper or in a journal. °3. Start counting kicks, flutters, swishes, rolls, or jabs in a 2 hour period. You should feel at least 10 movements within 2 hours. °4. If you do not feel 10 movements in 2 hours, wait 2-3 hours and count again. Look for a change in the pattern or not enough counts in 2 hours. °SEEK MEDICAL CARE IF: °· You feel less than 10 counts in 2 hours, tried twice. °· There is no movement in over an hour. °· The pattern is changing or taking longer each day to reach 10 counts in 2 hours. °· You feel the baby is not moving as he or she usually does. °Date: ____________ Movements: ____________ Start time: ____________ Finish time: ____________  °Date: ____________ Movements: ____________ Start time: ____________ Finish time: ____________ °Date: ____________ Movements: ____________ Start time: ____________ Finish time: ____________ °Date: ____________ Movements: ____________ Start time: ____________ Finish time: ____________ °Date: ____________ Movements: ____________ Start time: ____________ Finish time: ____________ °Date: ____________ Movements: ____________ Start time: ____________ Finish time: ____________ °Date: ____________ Movements: ____________ Start time: ____________ Finish time: ____________ °Date: ____________ Movements: ____________ Start time: ____________ Finish time: ____________  °Date: ____________ Movements: ____________ Start time:  ____________ Finish time: ____________ °Date: ____________ Movements: ____________ Start time: ____________ Finish time: ____________ °Date: ____________ Movements: ____________ Start time: ____________ Finish time: ____________ °Date: ____________ Movements: ____________ Start time: ____________ Finish time: ____________ °Date: ____________ Movements: ____________ Start time: ____________ Finish time: ____________ °Date: ____________ Movements: ____________ Start time: ____________ Finish time: ____________ °Date: ____________ Movements: ____________ Start time: ____________ Finish time: ____________  °Date: ____________ Movements: ____________ Start time: ____________ Finish time: ____________ °Date: ____________ Movements: ____________ Start time: ____________ Finish time: ____________ °Date: ____________ Movements: ____________ Start time: ____________ Finish time: ____________ °Date: ____________ Movements: ____________ Start time: ____________ Finish time: ____________ °Date: ____________ Movements: ____________ Start time: ____________ Finish time: ____________ °Date: ____________ Movements: ____________ Start time: ____________ Finish time: ____________ °Date: ____________ Movements: ____________ Start time: ____________ Finish time: ____________  °  Date: ____________ Movements: ____________ Start time: ____________ Finish time: ____________ °Date: ____________ Movements: ____________ Start time: ____________ Finish time: ____________ °Date: ____________ Movements: ____________ Start time: ____________ Finish time: ____________ °Date: ____________ Movements: ____________ Start time: ____________ Finish time: ____________ °Date: ____________ Movements: ____________ Start time: ____________ Finish time: ____________ °Date: ____________ Movements: ____________ Start time: ____________ Finish time: ____________ °Date: ____________ Movements: ____________ Start time: ____________ Finish time: ____________  °Date:  ____________ Movements: ____________ Start time: ____________ Finish time: ____________ °Date: ____________ Movements: ____________ Start time: ____________ Finish time: ____________ °Date: ____________ Movements: ____________ Start time: ____________ Finish time: ____________ °Date: ____________ Movements: ____________ Start time: ____________ Finish time: ____________ °Date: ____________ Movements: ____________ Start time: ____________ Finish time: ____________ °Date: ____________ Movements: ____________ Start time: ____________ Finish time: ____________ °Date: ____________ Movements: ____________ Start time: ____________ Finish time: ____________  °Date: ____________ Movements: ____________ Start time: ____________ Finish time: ____________ °Date: ____________ Movements: ____________ Start time: ____________ Finish time: ____________ °Date: ____________ Movements: ____________ Start time: ____________ Finish time: ____________ °Date: ____________ Movements: ____________ Start time: ____________ Finish time: ____________ °Date: ____________ Movements: ____________ Start time: ____________ Finish time: ____________ °Date: ____________ Movements: ____________ Start time: ____________ Finish time: ____________ °Date: ____________ Movements: ____________ Start time: ____________ Finish time: ____________  °Date: ____________ Movements: ____________ Start time: ____________ Finish time: ____________ °Date: ____________ Movements: ____________ Start time: ____________ Finish time: ____________ °Date: ____________ Movements: ____________ Start time: ____________ Finish time: ____________ °Date: ____________ Movements: ____________ Start time: ____________ Finish time: ____________ °Date: ____________ Movements: ____________ Start time: ____________ Finish time: ____________ °Date: ____________ Movements: ____________ Start time: ____________ Finish time: ____________ °Date: ____________ Movements: ____________ Start  time: ____________ Finish time: ____________  °Date: ____________ Movements: ____________ Start time: ____________ Finish time: ____________ °Date: ____________ Movements: ____________ Start time: ____________ Finish time: ____________ °Date: ____________ Movements: ____________ Start time: ____________ Finish time: ____________ °Date: ____________ Movements: ____________ Start time: ____________ Finish time: ____________ °Date: ____________ Movements: ____________ Start time: ____________ Finish time: ____________ °Date: ____________ Movements: ____________ Start time: ____________ Finish time: ____________ °Document Released: 09/28/2006 Document Revised: 08/15/2012 Document Reviewed: 06/25/2012 °[employer]® Patient Information ©2015 [employer]. This information is not intended to replace advice given to you by your health care provider. Make sure you discuss any questions you have with your health care provider. ° °

## 2014-03-26 NOTE — MAU Note (Signed)
Pt states she has not had pain, pt states she has had  contractions that are not painful. Pt states she has had them for an hour a 2

## 2014-03-27 ENCOUNTER — Encounter (HOSPITAL_COMMUNITY): Payer: Self-pay | Admitting: *Deleted

## 2014-03-27 ENCOUNTER — Telehealth (HOSPITAL_COMMUNITY): Payer: Self-pay | Admitting: *Deleted

## 2014-03-27 ENCOUNTER — Other Ambulatory Visit: Payer: Self-pay | Admitting: Obstetrics and Gynecology

## 2014-03-27 NOTE — Telephone Encounter (Signed)
Preadmission screen  

## 2014-03-29 ENCOUNTER — Inpatient Hospital Stay (HOSPITAL_COMMUNITY)
Admission: RE | Admit: 2014-03-29 | Discharge: 2014-04-01 | DRG: 774 | Disposition: A | Discharge status: Home / Self Care | Payer: BC Managed Care – PPO | Source: Ambulatory Visit | Attending: Obstetrics and Gynecology | Admitting: Obstetrics and Gynecology

## 2014-03-29 ENCOUNTER — Encounter (HOSPITAL_COMMUNITY): Payer: Self-pay

## 2014-03-29 DIAGNOSIS — Z3490 Encounter for supervision of normal pregnancy, unspecified, unspecified trimester: Secondary | ICD-10-CM

## 2014-03-29 DIAGNOSIS — Z8249 Family history of ischemic heart disease and other diseases of the circulatory system: Secondary | ICD-10-CM

## 2014-03-29 DIAGNOSIS — O9903 Anemia complicating the puerperium: Secondary | ICD-10-CM | POA: Diagnosis present

## 2014-03-29 DIAGNOSIS — O4100X0 Oligohydramnios, unspecified trimester, not applicable or unspecified: Secondary | ICD-10-CM | POA: Diagnosis present

## 2014-03-29 DIAGNOSIS — O368190 Decreased fetal movements, unspecified trimester, not applicable or unspecified: Principal | ICD-10-CM | POA: Diagnosis present

## 2014-03-29 DIAGNOSIS — D62 Acute posthemorrhagic anemia: Secondary | ICD-10-CM | POA: Diagnosis present

## 2014-03-29 LAB — CBC
HEMATOCRIT: 33.6 % — AB (ref 36.0–46.0)
Hemoglobin: 11.8 g/dL — ABNORMAL LOW (ref 12.0–15.0)
MCH: 32.2 pg (ref 26.0–34.0)
MCHC: 35.1 g/dL (ref 30.0–36.0)
MCV: 91.6 fL (ref 78.0–100.0)
Platelets: 188 10*3/uL (ref 150–400)
RBC: 3.67 MIL/uL — AB (ref 3.87–5.11)
RDW: 12.4 % (ref 11.5–15.5)
WBC: 8.6 10*3/uL (ref 4.0–10.5)

## 2014-03-29 MED ORDER — MISOPROSTOL 25 MCG QUARTER TABLET
25.0000 ug | ORAL_TABLET | ORAL | Status: DC | PRN
  Administered 2014-03-29: 25 ug via VAGINAL
  Filled 2014-03-29 (×2): qty 0.25

## 2014-03-29 MED ORDER — LACTATED RINGERS IV SOLN
INTRAVENOUS | Status: DC
  Administered 2014-03-29 – 2014-03-30 (×2): via INTRAVENOUS
  Administered 2014-03-30 (×2): 125 mL/h via INTRAVENOUS

## 2014-03-29 MED ORDER — TERBUTALINE SULFATE 1 MG/ML IJ SOLN: 0.2500 mg | Freq: Once | INTRAMUSCULAR | Status: AC | PRN

## 2014-03-29 MED ORDER — LIDOCAINE HCL (PF) 1 % IJ SOLN
30.0000 mL | INTRAMUSCULAR | Status: DC | PRN
  Filled 2014-03-29: qty 30

## 2014-03-29 MED ORDER — ACETAMINOPHEN 325 MG PO TABS
650.0000 mg | ORAL_TABLET | ORAL | Status: DC | PRN
  Administered 2014-03-30: 650 mg via ORAL
  Filled 2014-03-29: qty 2

## 2014-03-29 MED ORDER — ONDANSETRON HCL 4 MG/2ML IJ SOLN
4.0000 mg | Freq: Four times a day (QID) | INTRAMUSCULAR | Status: DC | PRN
  Administered 2014-03-30 – 2014-03-31 (×2): 4 mg via INTRAVENOUS
  Filled 2014-03-29 (×2): qty 2

## 2014-03-29 MED ORDER — CITRIC ACID-SODIUM CITRATE 334-500 MG/5ML PO SOLN: 30.0000 mL | ORAL | Status: DC | PRN

## 2014-03-29 MED ORDER — FLEET ENEMA 7-19 GM/118ML RE ENEM: 1.0000 | ENEMA | Freq: Every day | RECTAL | Status: DC | PRN

## 2014-03-29 MED ORDER — OXYTOCIN 40 UNITS IN LACTATED RINGERS INFUSION - SIMPLE MED
1.0000 m[IU]/min | INTRAVENOUS | Status: DC
  Administered 2014-03-30: 2 m[IU]/min via INTRAVENOUS
  Filled 2014-03-29: qty 1000

## 2014-03-29 MED ORDER — IBUPROFEN 600 MG PO TABS
600.0000 mg | ORAL_TABLET | Freq: Four times a day (QID) | ORAL | Status: DC | PRN
  Administered 2014-03-31: 600 mg via ORAL
  Filled 2014-03-29: qty 1

## 2014-03-29 MED ORDER — LACTATED RINGERS IV SOLN: 500.0000 mL | INTRAVENOUS | Status: DC | PRN

## 2014-03-29 MED ORDER — OXYTOCIN 40 UNITS IN LACTATED RINGERS INFUSION - SIMPLE MED
62.5000 mL/h | INTRAVENOUS | Status: DC
  Administered 2014-03-31: 62.5 mL/h via INTRAVENOUS

## 2014-03-29 MED ORDER — BUTORPHANOL TARTRATE 1 MG/ML IJ SOLN: 1.0000 mg | INTRAMUSCULAR | Status: DC | PRN

## 2014-03-29 MED ORDER — OXYCODONE-ACETAMINOPHEN 5-325 MG PO TABS: 1.0000 | ORAL_TABLET | ORAL | Status: DC | PRN

## 2014-03-29 MED ORDER — ZOLPIDEM TARTRATE 5 MG PO TABS: 5.0000 mg | ORAL_TABLET | Freq: Every evening | ORAL | Status: DC | PRN

## 2014-03-29 MED ORDER — OXYTOCIN BOLUS FROM INFUSION
500.0000 mL | INTRAVENOUS | Status: DC
  Administered 2014-03-30: 500 mL via INTRAVENOUS
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
## 2014-03-30 ENCOUNTER — Encounter (HOSPITAL_COMMUNITY): Payer: BC Managed Care – PPO | Admitting: Anesthesiology

## 2014-03-30 ENCOUNTER — Inpatient Hospital Stay (HOSPITAL_COMMUNITY): Payer: BC Managed Care – PPO | Admitting: Anesthesiology

## 2014-03-30 LAB — TYPE AND SCREEN
ABO/RH(D): B POS
ANTIBODY SCREEN: NEGATIVE

## 2014-03-30 LAB — ABO/RH: ABO/RH(D): B POS

## 2014-03-30 LAB — RPR

## 2014-03-30 MED ORDER — LACTATED RINGERS IV SOLN
500.0000 mL | Freq: Once | INTRAVENOUS | Status: AC
  Administered 2014-03-30: 500 mL via INTRAVENOUS

## 2014-03-30 MED ORDER — PHENYLEPHRINE 40 MCG/ML (10ML) SYRINGE FOR IV PUSH (FOR BLOOD PRESSURE SUPPORT)
PREFILLED_SYRINGE | INTRAVENOUS | Status: AC
  Filled 2014-03-30: qty 10

## 2014-03-30 MED ORDER — PHENYLEPHRINE 40 MCG/ML (10ML) SYRINGE FOR IV PUSH (FOR BLOOD PRESSURE SUPPORT): 80.0000 ug | PREFILLED_SYRINGE | INTRAVENOUS | Status: DC | PRN

## 2014-03-30 MED ORDER — FENTANYL 2.5 MCG/ML BUPIVACAINE 1/10 % EPIDURAL INFUSION (WH - ANES)
INTRAMUSCULAR | Status: DC | PRN
  Administered 2014-03-30: 14 mL/h via EPIDURAL

## 2014-03-30 MED ORDER — FENTANYL 2.5 MCG/ML BUPIVACAINE 1/10 % EPIDURAL INFUSION (WH - ANES)
14.0000 mL/h | INTRAMUSCULAR | Status: DC | PRN
  Administered 2014-03-30: 14 mL/h via EPIDURAL
  Filled 2014-03-30: qty 125

## 2014-03-30 MED ORDER — EPHEDRINE 5 MG/ML INJ: 10.0000 mg | INTRAVENOUS | Status: DC | PRN

## 2014-03-30 MED ORDER — EPHEDRINE 5 MG/ML INJ
INTRAVENOUS | Status: AC
  Filled 2014-03-30: qty 4

## 2014-03-30 MED ORDER — FENTANYL 2.5 MCG/ML BUPIVACAINE 1/10 % EPIDURAL INFUSION (WH - ANES)
INTRAMUSCULAR | Status: AC
  Administered 2014-03-30: 13:00:00
  Filled 2014-03-30: qty 125

## 2014-03-30 MED ORDER — DIPHENHYDRAMINE HCL 50 MG/ML IJ SOLN: 12.5000 mg | INTRAMUSCULAR | Status: DC | PRN

## 2014-03-30 MED ORDER — LIDOCAINE HCL (PF) 1 % IJ SOLN
INTRAMUSCULAR | Status: DC | PRN
  Administered 2014-03-30 (×2): 4 mL

## 2014-03-30 MED ORDER — FENTANYL 2.5 MCG/ML BUPIVACAINE 1/10 % EPIDURAL INFUSION (WH - ANES): 14.0000 mL/h | INTRAMUSCULAR | Status: DC | PRN

## 2014-03-30 NOTE — Progress Notes (Signed)
Dr Billy Coastaavon notified Pitocin is at 20 mu/mn

## 2014-03-30 NOTE — Anesthesia Procedure Notes (Signed)
Epidural Patient location during procedure: OB  Staffing Anesthesiologist: Bradlee Bridgers R Performed by: anesthesiologist   Preanesthetic Checklist Completed: patient identified, pre-op evaluation, timeout performed, IV checked, risks and benefits discussed and monitors and equipment checked  Epidural Patient position: sitting Prep: site prepped and draped and DuraPrep Patient monitoring: heart rate Approach: midline Location: L3-L4 Injection technique: LOR air and LOR saline  Needle:  Needle type: Tuohy  Needle gauge: 17 G Needle length: 9 cm Needle insertion depth: 6 cm Catheter type: closed end flexible Catheter size: 19 Gauge Catheter at skin depth: 12 cm Test dose: negative  Assessment Sensory level: T8 Events: blood not aspirated, injection not painful, no injection resistance, negative IV test and no paresthesia  Additional Notes Reason for block:procedure for pain   

## 2014-03-30 NOTE — Progress Notes (Signed)
Efraim KaufmannMelissa Carlynn Purlerez is a 23 y.o. G2P0010 at 3421w0d by LMP admitted for induction of labor due to Chronic LOF and decreased FM.  Subjective: Contractions mild to moderate, good FM  Objective: BP 134/82  Pulse 88  Temp(Src) 98.3 F (36.8 C) (Oral)  Resp 20  Ht 5\' 7"  (1.702 m)  Wt 68.04 kg (150 lb)  BMI 23.49 kg/m2  LMP 06/26/2013      FHT:  FHR: 145 bpm, variability: moderate,  accelerations:  Present,  decelerations:  Absent UC:   irregular, every 2-5 minutes SVE:   Dilation: 3 Effacement (%): 70 Station: -1 Exam by:: Dr Billy Coastaavon AROM- clear Hand noted next to vertex  Labs: Lab Results  Component Value Date   WBC 8.6 03/29/2014   HGB 11.8* 03/29/2014   HCT 33.6* 03/29/2014   MCV 91.6 03/29/2014   PLT 188 03/29/2014    Assessment / Plan: Induction of labor due to decreased FM,  progressing well on pitocin  Labor: Progressing normally Preeclampsia:  no signs or symptoms of toxicity Fetal Wellbeing:  Category I Pain Control:  Labor support without medications I/D:  n/a Anticipated MOD:  NSVD  Lexi Conaty J 03/30/2014, 11:25 AM

## 2014-03-30 NOTE — Anesthesia Preprocedure Evaluation (Signed)
Anesthesia Evaluation  Patient identified by MRN, date of birth, ID band Patient awake    Reviewed: Allergy & Precautions, H&P , Patient's Chart, lab work & pertinent test results, reviewed documented beta blocker date and time   History of Anesthesia Complications Negative for: history of anesthetic complications  Airway Mallampati: II TM Distance: >3 FB Neck ROM: full    Dental no notable dental hx.    Pulmonary neg pulmonary ROS,  breath sounds clear to auscultation  Pulmonary exam normal       Cardiovascular Exercise Tolerance: Good negative cardio ROS  Rhythm:regular Rate:Normal     Neuro/Psych negative neurological ROS  negative psych ROS   GI/Hepatic negative GI ROS, Neg liver ROS,   Endo/Other  negative endocrine ROS  Renal/GU negative Renal ROS     Musculoskeletal   Abdominal   Peds  Hematology negative hematology ROS (+)   Anesthesia Other Findings   Reproductive/Obstetrics (+) Pregnancy                           Anesthesia Physical  Anesthesia Plan  ASA: II  Anesthesia Plan: Epidural   Post-op Pain Management:    Induction:   Airway Management Planned:   Additional Equipment:   Intra-op Plan:   Post-operative Plan:   Informed Consent: I have reviewed the patients History and Physical, chart, labs and discussed the procedure including the risks, benefits and alternatives for the proposed anesthesia with the patient or authorized representative who has indicated his/her understanding and acceptance.     Plan Discussed with:   Anesthesia Plan Comments:         Anesthesia Quick Evaluation

## 2014-03-30 NOTE — Progress Notes (Signed)
Cindy Bryant is a 23 y.o. G2P0010 at 6825w0d by LMP admitted for induction of labor due to Dec FM and ? Chronic LOF.  Subjective: Comfortable with epidural  Objective: BP 113/70  Pulse 86  Temp(Src) 98.1 F (36.7 C) (Oral)  Resp 20  Ht 5\' 7"  (1.702 m)  Wt 68.04 kg (150 lb)  BMI 23.49 kg/m2  LMP 06/26/2013      FHT:  FHR: 130 bpm, variability: moderate,  accelerations:  Present,  decelerations:  Absent UC:   regular, every 2-3 minutes SVE:   5-6/80/-1-0 IUPC placed without difficulty  Labs: Lab Results  Component Value Date   WBC 8.6 03/29/2014   HGB 11.8* 03/29/2014   HCT 33.6* 03/29/2014   MCV 91.6 03/29/2014   PLT 188 03/29/2014    Assessment / Plan: Induction of labor due to dec FM and chronic LOF,  progressing well on pitocin  Labor: Progressing normally Preeclampsia:  no signs or symptoms of toxicity Fetal Wellbeing:  Category I Pain Control:  Epidural I/D:  n/a Anticipated MOD:  NSVD  Cindy Bryant 03/30/2014, 4:49 PM

## 2014-03-30 NOTE — Progress Notes (Signed)
Cindy Bryant is a 23 y.o. G2P0010 at 170w0d by LMP admitted for induction of labor due to decreased FM and ? Chronic LOF.  Subjective: Occ pressure  Objective: BP 118/71  Pulse 89  Temp(Src) 97.9 F (36.6 C) (Oral)  Resp 16  Ht 5\' 7"  (1.702 m)  Wt 68.04 kg (150 lb)  BMI 23.49 kg/m2  LMP 06/26/2013      FHT:  FHR: 135 bpm, variability: moderate,  accelerations:  Present,  decelerations:  Absent UC:   regular, every 2-3 minutes SVE:   8-9/90/0/LOA, asynclitic  Labs: Lab Results  Component Value Date   WBC 8.6 03/29/2014   HGB 11.8* 03/29/2014   HCT 33.6* 03/29/2014   MCV 91.6 03/29/2014   PLT 188 03/29/2014    Assessment / Plan: Induction of labor due to dec FM,  progressing well on pitocin  Labor: Progressing normally- will reposition in exaggerated Sims Preeclampsia:  no signs or symptoms of toxicity Fetal Wellbeing:  Category I Pain Control:  Epidural I/D:  n/a Anticipated MOD:  NSVD  Cindy Bryant J 03/30/2014, 8:48 PM

## 2014-03-31 ENCOUNTER — Encounter (HOSPITAL_COMMUNITY): Payer: Self-pay

## 2014-03-31 LAB — CBC
HCT: 29.1 % — ABNORMAL LOW (ref 36.0–46.0)
HEMOGLOBIN: 10.2 g/dL — AB (ref 12.0–15.0)
MCH: 31.8 pg (ref 26.0–34.0)
MCHC: 35.1 g/dL (ref 30.0–36.0)
MCV: 90.7 fL (ref 78.0–100.0)
PLATELETS: 162 10*3/uL (ref 150–400)
RBC: 3.21 MIL/uL — AB (ref 3.87–5.11)
RDW: 12.4 % (ref 11.5–15.5)
WBC: 16.9 10*3/uL — AB (ref 4.0–10.5)

## 2014-03-31 MED ORDER — LANOLIN HYDROUS EX OINT: TOPICAL_OINTMENT | CUTANEOUS | Status: DC | PRN

## 2014-03-31 MED ORDER — SENNOSIDES-DOCUSATE SODIUM 8.6-50 MG PO TABS
2.0000 | ORAL_TABLET | ORAL | Status: DC
  Administered 2014-04-01: 2 via ORAL
  Filled 2014-03-31: qty 2

## 2014-03-31 MED ORDER — BENZOCAINE-MENTHOL 20-0.5 % EX AERO
1.0000 "application " | INHALATION_SPRAY | CUTANEOUS | Status: DC | PRN
  Administered 2014-03-31: 1 via TOPICAL
  Filled 2014-03-31: qty 56

## 2014-03-31 MED ORDER — METHYLERGONOVINE MALEATE 0.2 MG PO TABS: 0.2000 mg | ORAL_TABLET | ORAL | Status: DC | PRN

## 2014-03-31 MED ORDER — IBUPROFEN 600 MG PO TABS
600.0000 mg | ORAL_TABLET | Freq: Four times a day (QID) | ORAL | Status: DC
  Administered 2014-03-31 – 2014-04-01 (×6): 600 mg via ORAL
  Filled 2014-03-31 (×6): qty 1

## 2014-03-31 MED ORDER — ZOLPIDEM TARTRATE 5 MG PO TABS: 5.0000 mg | ORAL_TABLET | Freq: Every evening | ORAL | Status: DC | PRN

## 2014-03-31 MED ORDER — PRENATAL MULTIVITAMIN CH
1.0000 | ORAL_TABLET | Freq: Every day | ORAL | Status: DC
  Administered 2014-03-31 – 2014-04-01 (×2): 1 via ORAL
  Filled 2014-03-31 (×2): qty 1

## 2014-03-31 MED ORDER — METHYLERGONOVINE MALEATE 0.2 MG/ML IJ SOLN
0.2000 mg | Freq: Once | INTRAMUSCULAR | Status: AC
  Administered 2014-03-30: 0.2 mg via INTRAMUSCULAR

## 2014-03-31 MED ORDER — DIPHENHYDRAMINE HCL 25 MG PO CAPS: 25.0000 mg | ORAL_CAPSULE | Freq: Four times a day (QID) | ORAL | Status: DC | PRN

## 2014-03-31 MED ORDER — METHYLERGONOVINE MALEATE 0.2 MG/ML IJ SOLN: 0.2000 mg | INTRAMUSCULAR | Status: DC | PRN

## 2014-03-31 MED ORDER — DIBUCAINE 1 % RE OINT: 1.0000 "application " | TOPICAL_OINTMENT | RECTAL | Status: DC | PRN

## 2014-03-31 MED ORDER — WITCH HAZEL-GLYCERIN EX PADS: 1.0000 "application " | MEDICATED_PAD | CUTANEOUS | Status: DC | PRN

## 2014-03-31 MED ORDER — POLYSACCHARIDE IRON COMPLEX 150 MG PO CAPS
150.0000 mg | ORAL_CAPSULE | Freq: Every day | ORAL | Status: DC
  Administered 2014-03-31 – 2014-04-01 (×2): 150 mg via ORAL
  Filled 2014-03-31 (×3): qty 1

## 2014-03-31 MED ORDER — ONDANSETRON HCL 4 MG PO TABS: 4.0000 mg | ORAL_TABLET | ORAL | Status: DC | PRN

## 2014-03-31 MED ORDER — SIMETHICONE 80 MG PO CHEW: 80.0000 mg | CHEWABLE_TABLET | ORAL | Status: DC | PRN

## 2014-03-31 MED ORDER — OXYCODONE-ACETAMINOPHEN 5-325 MG PO TABS
1.0000 | ORAL_TABLET | ORAL | Status: DC | PRN
  Administered 2014-03-31: 1 via ORAL
  Filled 2014-03-31: qty 1

## 2014-03-31 MED ORDER — ONDANSETRON HCL 4 MG/2ML IJ SOLN: 4.0000 mg | INTRAMUSCULAR | Status: DC | PRN

## 2014-03-31 MED ORDER — TETANUS-DIPHTH-ACELL PERTUSSIS 5-2.5-18.5 LF-MCG/0.5 IM SUSP: 0.5000 mL | Freq: Once | INTRAMUSCULAR | Status: DC
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
## 2014-03-31 NOTE — Anesthesia Postprocedure Evaluation (Signed)
Anesthesia Post Note  Patient: Cindy SnookMelissa Bryant  Procedure(s) Performed: * No procedures listed *  Anesthesia type: Epidural  Patient location: Mother/Baby  Post pain: Pain level controlled  Post assessment: Post-op Vital signs reviewed  Last Vitals:  Filed Vitals:   03/31/14 0630  BP: 110/68  Pulse: 83  Temp: 36.9 C  Resp: 18    Post vital signs: Reviewed  Level of consciousness:alert  Complications: No apparent anesthesia complications

## 2014-03-31 NOTE — Lactation Note (Signed)
This note was copied from the chart of Cindy Bryant Lablanc. Lactation Consultation Note  Patient Name: Cindy Bryant Hritz AVWUJ'WToday's Date: 03/31/2014 Reason for consult: Follow-up assessment;Difficult latch. Mom attempting to latch baby who is cuing but still dressed and quickly falling asleep.  LC encouraged STS and with brief assistance, hand expression (easily expressed colostrum), baby latches well and starts rhythmical sucking bursts and audible swallows for >8 minutes while LC remained with mom. FOB shown how to assist and mom denies any nipple discomfort.  Baby is on (R) breast in laying down football hold facing breast.  LC encouraged frequent STS and cue feedings with a minimum of 10-15 minutes on at least one breast to be considered an effective feeding.   Maternal Data Formula Feeding for Exclusion: No Infant to breast within first hour of birth: No Breastfeeding delayed due to:: Maternal status Has patient been taught Hand Expression?: Yes (mother states she sees colostrum when hand exp) Does the patient have breastfeeding experience prior to this delivery?: No  Feeding Feeding Type: Breast Fed Length of feed: 10 min  LATCH Score/Interventions Latch: Grasps breast easily, tongue down, lips flanged, rhythmical sucking. (needed stimulation prior to latch; brief chin tug) Intervention(s): Adjust position;Assist with latch;Breast compression  Audible Swallowing: Spontaneous and intermittent  Type of Nipple: Everted at rest and after stimulation (nipples short and breasts firm but slightly compressible)  Comfort (Breast/Nipple): Soft / non-tender     Hold (Positioning): Assistance needed to correctly position infant at breast and maintain latch. Intervention(s): Breastfeeding basics reviewed;Support Pillows;Position options;Skin to skin  LATCH Score: 9  (with LC)  Lactation Tools Discussed/Used   STS, waking and stimulation techniques, signs of proper latch, cue  feedings  Consult Status Consult Status: Follow-up Date: 04/01/14 Follow-up type: In-patient    Warrick ParisianBryant, Crislyn Willbanks [facility] 03/31/2014, 4:38 PM

## 2014-03-31 NOTE — Progress Notes (Signed)
PPD #1- SVD  Subjective:   Reports feeling well Tolerating po/ No nausea or vomiting Bleeding is moderate Pain controlled with Motrin Up ad lib / ambulatory / voiding without problems Newborn: breastfeeding     Objective:   VS:  VS:  Filed Vitals:   03/31/14 0100 03/31/14 0140 03/31/14 0240 03/31/14 0630  BP: 116/82 98/64 104/71 110/68  Pulse: 111 123 104 83  Temp: 99.7 F (37.6 C) 99.6 F (37.6 C) 99.4 F (37.4 C) 98.4 F (36.9 C)  TempSrc: Oral Oral Oral Oral  Resp: 20 18 18 18   Height:      Weight:      SpO2:   98% 96%    LABS:  Recent Labs  03/29/14 2025 03/31/14 0555  WBC 8.6 16.9*  HGB 11.8* 10.2*  PLT 188 162   Blood type: --/--/B POS, B POS (07/18 2025) Rubella: Immune (01/14 0000)   I&O: Intake/Output     07/19 0701 - 07/20 0700 07/20 0701 - 07/21 0700   Blood 400    Total Output 400     Net -400            Physical Exam: Alert and oriented x3 Abdomen: soft, non-tender, non-distended  Fundus: firm, non-tender, U-1 Perineum: Well approximated, no significant erythema, edema, or drainage; healing well. Lochia: small Extremities: No edema, no calf pain or tenderness    Assessment:  PPD # 1G2P1011/ S/P:induced vaginal, 2nd degree laceration Mild ABL anemia  Doing well    Plan: Continue routine post partum orders Anticipate D/C home tomorrow   Donette LarryBHAMBRI, Jaslin Novitski, N MSN, CNM 03/31/2014, 10:47 AM

## 2014-04-01 MED ORDER — POLYSACCHARIDE IRON COMPLEX 150 MG PO CAPS: 150.0000 mg | ORAL_CAPSULE | Freq: Every day | ORAL | Status: DC

## 2014-04-01 MED ORDER — IBUPROFEN 600 MG PO TABS: 600.0000 mg | ORAL_TABLET | Freq: Four times a day (QID) | ORAL | Status: DC

## 2014-04-01 NOTE — Progress Notes (Signed)
PPD #2 - SVD  Subjective:   Reports feeling well Tolerating po/ No nausea or vomiting Bleeding is moderate Pain controlled with Motrin Up ad lib / ambulatory / voiding without problems Newborn: breastfeeding     Objective:   VS:  VS:  Filed Vitals:   03/31/14 0240 03/31/14 0630 03/31/14 1800 04/01/14 0533  BP: 104/71 110/68 103/69 100/62  Pulse: 104 83 95 80  Temp: 99.4 F (37.4 C) 98.4 F (36.9 C) 97.8 F (36.6 C) 98 F (36.7 C)  TempSrc: Oral Oral Oral Oral  Resp: 18 18 18 18   Height:      Weight:      SpO2: 98% 96%      LABS:   Recent Labs  03/29/14 2025 03/31/14 0555  WBC 8.6 16.9*  HGB 11.8* 10.2*  PLT 188 162   Blood type: --/--/B POS, B POS (07/18 2025) Rubella: Immune (01/14 0000)   I&O: Intake/Output     07/20 0701 - 07/21 0700 07/21 0701 - 07/22 0700   Blood     Total Output       Net              Physical Exam: Alert and oriented x3 Abdomen: soft, non-tender, non-distended  Fundus: firm, non-tender, U-2 Perineum: Well approximated, no significant erythema, edema, or drainage; healing well. Lochia: small Extremities: No edema, no calf pain or tenderness    Assessment:  PPD # 2 / [id number] / S/P:induced vaginal, 2nd degree laceration Mild ABL anemia  Doing well    Plan: Continue routine post partum orders D/C home today   Raelyn MoraAWSON, Yassine Brunsman, M MSN, CNM 04/01/2014, 8:18 AM

## 2014-04-01 NOTE — Lactation Note (Signed)
This note was copied from the chart of Girl Daleen SnookMelissa Cowley. Lactation Consultation Note  Baby sleeping on mother's chest.  Mother's right nipple has blister. Encouraged breast compression when latching to achieve a deeper latch. Provided mother with comfort gels and hand pump and reviewed use. Reviewed applying expressed breast milk and air dry and engorgement care. Mom encouraged to feed baby 8-12 times/24 hours and with feeding cues.  Left lactation phone number to call to view latch.  Patient Name: [name] [id number]'s Date: 04/01/2014 Reason for consult: Follow-up assessment   Maternal Data    Feeding Feeding Type: Breast Fed Length of feed: 10 min  LATCH Score/Interventions                      Lactation Tools Discussed/Used     Consult Status Consult Status: Complete    Hardie PulleyBerkelhammer, Lejla Moeser Boschen 04/01/2014, 8:36 AM

## 2014-04-01 NOTE — Discharge Instructions (Signed)
Breast Pumping Tips °If you are breastfeeding, there may be times when you cannot feed your baby directly. Returning to work or going on a trip are common examples. Pumping allows you to store breast milk and feed it to your baby later.  °You may not get much milk when you first start to pump. Your breasts should start to make more after a few days. If you pump at the times you usually feed your baby, you may be able to keep making enough milk to feed your baby without also using formula. The more often you pump, the more milk you will produce.  °WHEN SHOULD I PUMP?  °· You can begin to pump soon after delivery. However, some experts recommend waiting about 4 weeks before giving your infant a bottle to make sure breastfeeding is going well.  °· If you plan to return to work, begin pumping a few weeks before. This will help you develop techniques that work best for you. It also lets you build up a supply of breast milk.   °· When you are with your infant, feed on demand and pump after each feeding.   °· When you are away from your infant for several hours, pump for about 15 minutes every 2-3 hours. Pump both breasts at the same time if you can.   °· If your infant has a formula feeding, make sure to pump around the same time.     °· If you drink any alcohol, wait 2 hours before pumping.   °HOW DO I PREPARE TO PUMP? °Your let-down reflex is the natural reaction to stimulation that makes your breast milk flow. It is easier to stimulate this reflex when you are relaxed. Find relaxation techniques that work for you. If you have difficulty with your let-down reflex, try these methods:  °· Smell one of your infant's blankets or an item of clothing.   °· Look at a picture or video of your infant.   °· Sit in a quiet, private space.   °· Massage the breast you plan to pump.   °· Place soothing warmth on the breast.   °· Play relaxing music.   °WHAT ARE SOME GENERAL BREAST PUMPING TIPS? °· Wash your hands before you pump. You  do not need to wash your nipples or breasts. °· There are three ways to pump. °¨ You can use your hand to massage and compress your breast. °¨ You can use a handheld manual pump. °¨ You can use an electric pump.   °· Make sure the suction cup (flange) on the breast pump is the right size. Place the flange directly over the nipple. If it is the wrong size or placed the wrong way, it may be painful and cause nipple damage.   °· If pumping is uncomfortable, apply a small amount of purified or modified lanolin to your nipple and areola. °· If you are using an electric pump, adjust the speed and suction power to be more comfortable. °· If pumping is painful or if you are not getting very much milk, you may need a different type of pump. A lactation consultant can help you determine what type of pump to use.   °· Keep a full water bottle near you at all times. Drinking lots of fluid helps you make more milk.  °· You can store your milk to use later. Pumped breast milk can be stored in a sealable, sterile container or plastic bag. Label all stored breast milk with the date you pumped it. °¨ Milk can stay out at room temperature for up to 8 hours. °¨   You can store your milk in the refrigerator for up to 8 days. °¨ You can store your milk in the freezer for 3 months. Thaw frozen milk using warm water. Do not put it in the microwave. °· Do not smoke. Smoking can lower your milk supply and harm your infant. If you need help quitting, ask your health care provider to recommend a program.   °WHEN SHOULD I CALL MY HEALTH CARE PROVIDER OR A LACTATION CONSULTANT? °· You are having trouble pumping. °· You are concerned that you are not making enough milk. °· You have nipple pain, soreness, or redness. °· You want to use birth control. Birth control pills may lower your milk supply. Talk to your health care provider about your options. °Document Released: 02/16/2010 Document Revised: 09/03/2013 Document Reviewed:  06/21/2013 °Exit[employer]® Patient Information ©2015 [employer]. This information is not intended to replace advice given to you by your health care provider. Make sure you discuss any questions you have with your health care provider. ° °Nutrition for the New Mother  °A new mother needs good health and nutrition so she can have energy to take care of a new baby. Whether a mother breastfeeds or formula feeds the baby, it is important to have a well-balanced diet. Foods from all the food groups should be chosen to meet the new mother's energy needs and to give her the nutrients needed for repair and healing.  °A HEALTHY EATING PLAN °The My [employer] plan for Moms outlines what you should eat to help you and your baby stay healthy. The energy and amount of food you need depends on whether or not you are breastfeeding. If you are breastfeeding you will need more nutrients. If you choose not to breastfeed, your nutrition goal should be to return to a healthy weight. Limiting calories may be needed if you are not breastfeeding.  °HOME CARE INSTRUCTIONS  °· For a personal plan based on your unique needs, see your Registered Dietitian or visit www.mypyramid.gov. °· Eat a variety of foods. The plan below will help guide you. The following chart has a suggested daily meal plan from the [employer] for Moms. °· Eat a variety of fruits and vegetables. °· Eat more dark green and orange vegetables and cooked dried beans. °· Make half your grains whole grains. Choose whole instead of refined grains. °· Choose low-fat or lean meats and poultry. °· Choose low-fat or fat-free dairy products like milk, cheese, or yogurt. °Fruits °· Breastfeeding: 2 cups °· Non-Breastfeeding: 2 cups °· What Counts as a serving? °¨ 1 cup of fruit or juice. °¨ ½ cup dried fruit. °Vegetables °· Breastfeeding: 3 cups °· Non-Breastfeeding: 2 ½ cups °· What Counts as a serving? °¨ 1 cup raw or cooked vegetables. °¨ Juice or 2 cups raw leafy  vegetables. °Grains °· Breastfeeding: 8 oz °· Non-Breastfeeding: 6 oz °· What Counts as a serving? °¨ 1 slice bread. °¨ 1 oz ready-to-eat cereal. °¨ ½ cup cooked pasta, rice, or cereal. °Meat and Beans °· Breastfeeding: 6 ½ oz °· Non-Breastfeeding: 5 ½ oz °· What Counts as a serving? °¨ 1 oz lean meat, poultry, or fish °¨ ¼ cup cooked dry beans °¨ ½ oz nuts or 1 egg °¨ 1 tbs peanut butter °Milk °· Breastfeeding: 3 cups °· Non-Breastfeeding: 3 cups °· What Counts as a serving? °¨ 1 cup milk. °¨ 8 oz yogurt. °¨ 1 ½ oz cheese. °¨ 2 oz processed cheese. °TIPS FOR THE BREASTFEEDING MOM °· Rapid weight   loss is not suggested when you are breastfeeding. By simply breastfeeding, you will be able to lose the weight gained during your pregnancy. Your caregiver can keep track of your weight and tell you if your weight loss is appropriate. °· Be sure to drink fluids. You may notice that you are thirstier than usual. A suggestion is to drink a glass of water or other beverage whenever you breastfeed. °· Avoid alcohol as it can be passed into your breast milk. °· Limit caffeine drinks to no more than 2 to 3 cups per day. °· You may need to keep taking your prenatal vitamin while you are breastfeeding. Talk with your caregiver about taking a vitamin or supplement. °RETURING TO A HEALTHY WEIGHT °· The My [employer] Plan for Moms will help you return to a healthy weight. It will also provide the nutrients you need. °· You may need to limit "empty" calories. These include: °¨ High fat foods like fried foods, fatty meats, fast food, butter, and mayonnaise. °¨ High sugar foods like sodas, jelly, candy, and sweets. °· Be physically active. Include 30 minutes of exercise or more each day. Choose an activity you like such as walking, swimming, biking, or aerobics. Check with your caregiver before you start to exercise. °Document Released: 12/06/2007 Document Revised: 11/21/2011 Document Reviewed: 12/06/2007 °ExitCare® Patient Information  ©2015 [employer]. This information is not intended to replace advice given to you by your health care provider. Make sure you discuss any questions you have with your health care provider. °Postpartum Depression and Baby Blues °The postpartum period begins right after the birth of a baby. During this time, there is often a great amount of joy and excitement. It is also a time of many changes in the life of the parents. Regardless of how many times a mother gives birth, each child brings new challenges and dynamics to the family. It is not unusual to have feelings of excitement along with confusing shifts in moods, emotions, and thoughts. All mothers are at risk of developing postpartum depression or the "baby blues." These mood changes can occur right after giving birth, or they may occur many months after giving birth. The baby blues or postpartum depression can be mild or severe. Additionally, postpartum depression can go away rather quickly, or it can be a long-term condition.  °CAUSES °Raised hormone levels and the rapid drop in those levels are thought to be a main cause of postpartum depression and the baby blues. A number of hormones change during and after pregnancy. Estrogen and progesterone usually decrease right after the delivery of your baby. The levels of thyroid hormone and various cortisol steroids also rapidly drop. Other factors that play a role in these mood changes include major life events and genetics.  °RISK FACTORS °If you have any of the following risks for the baby blues or postpartum depression, know what symptoms to watch out for during the postpartum period. Risk factors that may increase the likelihood of getting the baby blues or postpartum depression include: °· Having a personal or family history of depression.   °· Having depression while being pregnant.   °· Having premenstrual mood issues or mood issues related to oral contraceptives. °· Having a lot of life stress.   °· Having  marital conflict.   °· Lacking a social support network.   °· Having a baby with special needs.   °· Having health problems, such as diabetes.   °SIGNS AND SYMPTOMS °Symptoms of baby blues include: °· Brief changes in mood, such as going   from extreme happiness to sadness. °· Decreased concentration.   °· Difficulty sleeping.   °· Crying spells, tearfulness.   °· Irritability.   °· Anxiety.   °Symptoms of postpartum depression typically begin within the first month after giving birth. These symptoms include: °· Difficulty sleeping or excessive sleepiness.   °· Marked weight loss.   °· Agitation.   °· Feelings of worthlessness.   °· Lack of interest in activity or food.   °Postpartum psychosis is a very serious condition and can be dangerous. Fortunately, it is rare. Displaying any of the following symptoms is cause for immediate medical attention. Symptoms of postpartum psychosis include:  °· Hallucinations and delusions.   °· Bizarre or disorganized behavior.   °· Confusion or disorientation.   °DIAGNOSIS  °A diagnosis is made by an evaluation of your symptoms. There are no medical or lab tests that lead to a diagnosis, but there are various questionnaires that a health care provider may use to identify those with the baby blues, postpartum depression, or psychosis. Often, a screening tool called the Edinburgh Postnatal Depression Scale is used to diagnose depression in the postpartum period.  °TREATMENT °The baby blues usually goes away on its own in 1-2 weeks. Social support is often all that is needed. You will be encouraged to get adequate sleep and rest. Occasionally, you may be given medicines to help you sleep.  °Postpartum depression requires treatment because it can last several months or longer if it is not treated. Treatment may include individual or group therapy, medicine, or both to address any social, physiological, and psychological factors that may play a role in the depression. Regular exercise, a  healthy diet, rest, and social support may also be strongly recommended.  °Postpartum psychosis is more serious and needs treatment right away. Hospitalization is often needed. °HOME CARE INSTRUCTIONS °· Get as much rest as you can. Nap when the baby sleeps.   °· Exercise regularly. Some women find yoga and walking to be beneficial.   °· Eat a balanced and nourishing diet.   °· Do little things that you enjoy. Have a cup of tea, take a bubble bath, read your favorite magazine, or listen to your favorite music. °· Avoid alcohol.   °· Ask for help with household chores, cooking, grocery shopping, or running errands as needed. Do not try to do everything.   °· Talk to people close to you about how you are feeling. Get support from your partner, family members, friends, or other new moms. °· Try to stay positive in how you think. Think about the things you are grateful for.   °· Do not spend a lot of time alone.   °· Only take over-the-counter or prescription medicine as directed by your health care provider. °· Keep all your postpartum appointments.   °· Let your health care provider know if you have any concerns.   °SEEK MEDICAL CARE IF: °You are having a reaction to or problems with your medicine. °SEEK IMMEDIATE MEDICAL CARE IF: °· You have suicidal feelings.   °· You think you may harm the baby or someone else. °MAKE SURE YOU: °· Understand these instructions. °· Will watch your condition. °· Will get help right away if you are not doing well or get worse. °Document Released: 06/02/2004 Document Revised: 09/03/2013 Document Reviewed: 06/10/2013 °Exit[employer]® Patient Information ©2015 [employer]. This information is not intended to replace advice given to you by your health care provider. Make sure you discuss any questions you have with your health care provider. °Breastfeeding and Mastitis °Mastitis is inflammation of the breast tissue. It can occur in women who   are breastfeeding. This can make breastfeeding  painful. Mastitis will sometimes go away on its own. Your health care provider will help determine if treatment is needed. °CAUSES °Mastitis is often associated with a blocked milk (lactiferous) duct. This can happen when too much milk builds up in the breast. Causes of excess milk in the breast can include: °· Poor latch-on. If your baby is not latched onto the breast properly, she or he may not empty your breast completely while breastfeeding. °· Allowing too much time to pass between feedings. °· Wearing a bra or other clothing that is too tight. This puts extra pressure on the lactiferous ducts so milk does not flow through them as it should. °Mastitis can also be caused by a bacterial infection. Bacteria may enter the breast tissue through cuts or openings in the skin. In women who are breastfeeding, this may occur because of cracked or irritated skin. Cracks in the skin are often caused when your baby does not latch on properly to the breast. °SIGNS AND SYMPTOMS °· Swelling, redness, tenderness, and pain in an area of the breast. °· Swelling of the glands under the arm on the same side. °· Fever may or may not accompany mastitis. °If an infection is allowed to progress, a collection of pus (abscess) may develop. °DIAGNOSIS  °Your health care provider can usually diagnose mastitis based on your symptoms and a physical exam. Tests may be done to help confirm the diagnosis. These may include: °· Removal of pus from the breast by applying pressure to the area. This pus can be examined in the lab to determine which bacteria are present. If an abscess has developed, the fluid in the abscess can be removed with a needle. This can also be used to confirm the diagnosis and determine the bacteria present. In most cases, pus will not be present. °· Blood tests to determine if your body is fighting a bacterial infection. °· Mammogram or ultrasound tests to rule out other problems or diseases. °TREATMENT  °Mastitis that  occurs with breastfeeding will sometimes go away on its own. Your health care provider may choose to wait 24 hours after first seeing you to decide whether a prescription medicine is needed. If your symptoms are worse after 24 hours, your health care provider will likely prescribe an antibiotic medicine to treat the mastitis. He or she will determine which bacteria are most likely causing the infection and will then select an appropriate antibiotic medicine. This is sometimes changed based on the results of tests performed to identify the bacteria, or if there is no response to the antibiotic medicine selected. Antibiotic medicines are usually given by mouth. You may also be given medicine for pain. °HOME CARE INSTRUCTIONS °· Only take over-the-counter or prescription medicines for pain, fever, or discomfort as directed by your health care provider. °· If your health care provider prescribed an antibiotic medicine, take the medicine as directed. Make sure you finish it even if you start to feel better. °· Do not wear a tight or underwire bra. Wear a soft, supportive bra. °· Increase your fluid intake, especially if you have a fever. °· Continue to empty the breast. Your health care provider can tell you whether this milk is safe for your infant or needs to be thrown out. You may be told to stop nursing until your health care provider thinks it is safe for your baby. Use a breast pump if you are advised to stop nursing. °· Keep your nipples   clean and dry. °· Empty the first breast completely before going to the other breast. If your baby is not emptying your breasts completely for some reason, use a breast pump to empty your breasts. °· If you go back to work, pump your breasts while at work to stay in time with your nursing schedule. °· Avoid allowing your breasts to become overly filled with milk (engorged). °SEEK MEDICAL CARE IF: °· You have pus-like discharge from the breast. °· Your symptoms do not improve with  the treatment prescribed by your health care provider within 2 days. °SEEK IMMEDIATE MEDICAL CARE IF: °· Your pain and swelling are getting worse. °· You have pain that is not controlled with medicine. °· You have a red line extending from the breast toward your armpit. °· You have a fever or persistent symptoms for more than 2-3 days. °· You have a fever and your symptoms suddenly get worse. °MAKE SURE YOU:  °· Understand these instructions. °· Will watch your condition. °· Will get help right away if you are not doing well or get worse. °Document Released: 12/24/2004 Document Revised: 09/03/2013 Document Reviewed: 04/04/2013 °[employer]® Patient Information ©2015 [employer]. This information is not intended to replace advice given to you by your health care provider. Make sure you discuss any questions you have with your health care provider. °Breastfeeding °Deciding to breastfeed is one of the best choices you can make for you and your baby. A change in hormones during pregnancy causes your breast tissue to grow and increases the number and size of your milk ducts. These hormones also allow proteins, sugars, and fats from your blood supply to make breast milk in your milk-producing glands. Hormones prevent breast milk from being released before your baby is born as well as prompt milk flow after birth. Once breastfeeding has begun, thoughts of your baby, as well as his or her sucking or crying, can stimulate the release of milk from your milk-producing glands.  °BENEFITS OF BREASTFEEDING °For Your Baby °· Your first milk (colostrum) helps your baby's digestive system function better.   °· There are antibodies in your milk that help your baby fight off infections.   °· Your baby has a lower incidence of asthma, allergies, and sudden infant death syndrome.   °· The nutrients in breast milk are better for your baby than infant formulas and are designed uniquely for your baby's needs.   °· Breast milk improves your  baby's brain development.   °· Your baby is less likely to develop other conditions, such as childhood obesity, asthma, or type 2 diabetes mellitus.   °For You  °· Breastfeeding helps to create a very special bond between you and your baby.   °· Breastfeeding is convenient. Breast milk is always available at the correct temperature and costs nothing.   °· Breastfeeding helps to burn calories and helps you lose the weight gained during pregnancy.   °· Breastfeeding makes your uterus contract to its prepregnancy size faster and slows bleeding (lochia) after you give birth.   °· Breastfeeding helps to lower your risk of developing type 2 diabetes mellitus, osteoporosis, and breast or ovarian cancer later in life. °SIGNS THAT YOUR BABY IS HUNGRY °Early Signs of Hunger  °· Increased alertness or activity. °· Stretching. °· Movement of the head from side to side. °· Movement of the head and opening of the mouth when the corner of the mouth or cheek is stroked (rooting). °· Increased sucking sounds, smacking lips, cooing, sighing, or squeaking. °· Hand-to-mouth movements. °· Increased sucking of   fingers or hands. °Late Signs of Hunger °· Fussing. °· Intermittent crying. °Extreme Signs of Hunger °Signs of extreme hunger will require calming and consoling before your baby will be able to breastfeed successfully. Do not wait for the following signs of extreme hunger to occur before you initiate breastfeeding:   °· Restlessness. °· A loud, strong cry. °·  Screaming. °BREASTFEEDING BASICS °Breastfeeding Initiation °· Find a comfortable place to sit or lie down, with your neck and back well supported. °· Place a pillow or rolled up blanket under your baby to bring him or her to the level of your breast (if you are seated). Nursing pillows are specially designed to help support your arms and your baby while you breastfeed. °· Make sure that your baby's abdomen is facing your abdomen.   °· Gently massage your breast. With your  fingertips, massage from your chest wall toward your nipple in a circular motion. This encourages milk flow. You may need to continue this action during the feeding if your milk flows slowly. °· Support your breast with 4 fingers underneath and your thumb above your nipple. Make sure your fingers are well away from your nipple and your baby's mouth.   °· Stroke your baby's lips gently with your finger or nipple.   °· When your baby's mouth is open wide enough, quickly bring your baby to your breast, placing your entire nipple and as much of the colored area around your nipple (areola) as possible into your baby's mouth.   °¨ More areola should be visible above your baby's upper lip than below the lower lip.   °¨ Your baby's tongue should be between his or her lower gum and your breast.   °· Ensure that your baby's mouth is correctly positioned around your nipple (latched). Your baby's lips should create a seal on your breast and be turned out (everted). °· It is common for your baby to suck about 2-3 minutes in order to start the flow of breast milk. °Latching °Teaching your baby how to latch on to your breast properly is very important. An improper latch can cause nipple pain and decreased milk supply for you and poor weight gain in your baby. Also, if your baby is not latched onto your nipple properly, he or she may swallow some air during feeding. This can make your baby fussy. Burping your baby when you switch breasts during the feeding can help to get rid of the air. However, teaching your baby to latch on properly is still the best way to prevent fussiness from swallowing air while breastfeeding. °Signs that your baby has successfully latched on to your nipple:    °· Silent tugging or silent sucking, without causing you pain.   °· Swallowing heard between every 3-4 sucks.   °·  Muscle movement above and in front of his or her ears while sucking.   °Signs that your baby has not successfully latched on to  nipple:  °· Sucking sounds or smacking sounds from your baby while breastfeeding. °· Nipple pain. °If you think your baby has not latched on correctly, slip your finger into the corner of your baby's mouth to break the suction and place it between your baby's gums. Attempt breastfeeding initiation again. °Signs of Successful Breastfeeding °Signs from your baby:   °· A gradual decrease in the number of sucks or complete cessation of sucking.   °· Falling asleep.   °· Relaxation of his or her body.   °· Retention of a small amount of milk in his or her mouth.   °· Letting go   of your breast by himself or herself. °Signs from you: °· Breasts that have increased in firmness, weight, and size 1-3 hours after feeding.   °· Breasts that are softer immediately after breastfeeding. °· Increased milk volume, as well as a change in milk consistency and color by the fifth day of breastfeeding.   °· Nipples that are not sore, cracked, or bleeding. °Signs That Your Baby is Getting Enough Milk °· Wetting at least 3 diapers in a 24-hour period. The urine should be clear and pale yellow by age 5 days. °· At least 3 stools in a 24-hour period by age 5 days. The stool should be soft and yellow. °· At least 3 stools in a 24-hour period by age 7 days. The stool should be seedy and yellow. °· No loss of weight greater than 10% of birth weight during the first 3 days of age. °· Average weight gain of 4-7 ounces (113-198 g) per week after age 4 days. °· Consistent daily weight gain by age 5 days, without weight loss after the age of 2 weeks. °After a feeding, your baby may spit up a small amount. This is common. °BREASTFEEDING FREQUENCY AND DURATION °Frequent feeding will help you make more milk and can prevent sore nipples and breast engorgement. Breastfeed when you feel the need to reduce the fullness of your breasts or when your baby shows signs of hunger. This is called "breastfeeding on demand." Avoid introducing a pacifier to your  baby while you are working to establish breastfeeding (the first 4-6 weeks after your baby is born). After this time you may choose to use a pacifier. Research has shown that pacifier use during the first year of a baby's life decreases the risk of sudden infant death syndrome (SIDS). °Allow your baby to feed on each breast as long as he or she wants. Breastfeed until your baby is finished feeding. When your baby unlatches or falls asleep while feeding from the first breast, offer the second breast. Because newborns are often sleepy in the first few weeks of life, you may need to awaken your baby to get him or her to feed. °Breastfeeding times will vary from baby to baby. However, the following rules can serve as a guide to help you ensure that your baby is properly fed: °· Newborns (babies 4 weeks of age or younger) may breastfeed every 1-3 hours. °· Newborns should not go longer than 3 hours during the day or 5 hours during the night without breastfeeding. °· You should breastfeed your baby a minimum of 8 times in a 24-hour period until you begin to introduce solid foods to your baby at around 6 months of age. °BREAST MILK PUMPING °Pumping and storing breast milk allows you to ensure that your baby is exclusively fed your breast milk, even at times when you are unable to breastfeed. This is especially important if you are going back to work while you are still breastfeeding or when you are not able to be present during feedings. Your lactation consultant can give you guidelines on how long it is safe to store breast milk.  °A breast pump is a machine that allows you to pump milk from your breast into a sterile bottle. The pumped breast milk can then be stored in a refrigerator or freezer. Some breast pumps are operated by hand, while others use electricity. Ask your lactation consultant which type will work best for you. Breast pumps can be purchased, but some hospitals and breastfeeding support groups   lease  breast pumps on a monthly basis. A lactation consultant can teach you how to hand express breast milk, if you prefer not to use a pump.  °CARING FOR YOUR BREASTS WHILE YOU BREASTFEED °Nipples can become dry, cracked, and sore while breastfeeding. The following recommendations can help keep your breasts moisturized and healthy: °· Avoid using soap on your nipples.   °· Wear a supportive bra. Although not required, special nursing bras and tank tops are designed to allow access to your breasts for breastfeeding without taking off your entire bra or top. Avoid wearing underwire-style bras or extremely tight bras. °· Air dry your nipples for 3-4 minutes after each feeding.   °· Use only cotton bra pads to absorb leaked breast milk. Leaking of breast milk between feedings is normal.   °· Use lanolin on your nipples after breastfeeding. Lanolin helps to maintain your skin's normal moisture barrier. If you use pure lanolin, you do not need to wash it off before feeding your baby again. Pure lanolin is not toxic to your baby. You may also hand express a few drops of breast milk and gently massage that milk into your nipples and allow the milk to air dry. °In the first few weeks after giving birth, some women experience extremely full breasts (engorgement). Engorgement can make your breasts feel heavy, warm, and tender to the touch. Engorgement peaks within 3-5 days after you give birth. The following recommendations can help ease engorgement: °· Completely empty your breasts while breastfeeding or pumping. You may want to start by applying warm, moist heat (in the shower or with warm water-soaked hand towels) just before feeding or pumping. This increases circulation and helps the milk flow. If your baby does not completely empty your breasts while breastfeeding, pump any extra milk after he or she is finished. °· Wear a snug bra (nursing or regular) or tank top for 1-2 days to signal your body to slightly decrease milk  production. °· Apply ice packs to your breasts, unless this is too uncomfortable for you. °· Make sure that your baby is latched on and positioned properly while breastfeeding. °If engorgement persists after 48 hours of following these recommendations, contact your health care provider or a lactation consultant. °OVERALL HEALTH CARE RECOMMENDATIONS WHILE BREASTFEEDING °· Eat healthy foods. Alternate between meals and snacks, eating 3 of each per day. Because what you eat affects your breast milk, some of the foods may make your baby more irritable than usual. Avoid eating these foods if you are sure that they are negatively affecting your baby. °· Drink milk, fruit juice, and water to satisfy your thirst (about 10 glasses a day).   °· Rest often, relax, and continue to take your prenatal vitamins to prevent fatigue, stress, and anemia. °· Continue breast self-awareness checks. °· Avoid chewing and smoking tobacco. °· Avoid alcohol and drug use. °Some medicines that may be harmful to your baby can pass through breast milk. It is important to ask your health care provider before taking any medicine, including all over-the-counter and prescription medicine as well as vitamin and herbal supplements. °It is possible to become pregnant while breastfeeding. If birth control is desired, ask your health care provider about options that will be safe for your baby. °SEEK MEDICAL CARE IF:  °· You feel like you want to stop breastfeeding or have become frustrated with breastfeeding. °· You have painful breasts or nipples. °· Your nipples are cracked or bleeding. °· Your breasts are red, tender, or warm. °· You have   a swollen area on either breast. °· You have a fever or chills. °· You have nausea or vomiting. °· You have drainage other than breast milk from your nipples. °· Your breasts do not become full before feedings by the fifth day after you give birth. °· You feel sad and depressed. °· Your baby is too sleepy to eat  well. °· Your baby is having trouble sleeping.   °· Your baby is wetting less than 3 diapers in a 24-hour period. °· Your baby has less than 3 stools in a 24-hour period. °· Your baby's skin or the white part of his or her eyes becomes yellow.   °· Your baby is not gaining weight by 5 days of age. °SEEK IMMEDIATE MEDICAL CARE IF:  °· Your baby is overly tired (lethargic) and does not want to wake up and feed. °· Your baby develops an unexplained fever. °Document Released: 08/29/2005 Document Revised: 09/03/2013 Document Reviewed: 02/20/2013 °[employer]® Patient Information ©2015 [employer]. This information is not intended to replace advice given to you by your health care provider. Make sure you discuss any questions you have with your health care provider. ° °

## 2014-04-01 NOTE — Discharge Summary (Signed)
Obstetric Discharge Summary Reason for Admission: induction of labor Prenatal Procedures: NST and ultrasound Intrapartum Procedures: spontaneous vaginal delivery Postpartum Procedures: none Complications-Operative and Postpartum: 2nd degree perineal laceration Hemoglobin  Date Value Ref Range Status  03/31/2014 10.2* 12.0 - 15.0 g/dL Final     HCT  Date Value Ref Range Status  03/31/2014 29.1* 36.0 - 46.0 % Final    Physical Exam:  General: alert, cooperative and no distress Lochia: appropriate Uterine Fundus: firm Perineum: 2nd degree repair healing well DVT Evaluation: No evidence of DVT seen on physical exam. Negative Homan's sign. No cords or calf tenderness. No significant calf/ankle edema.  Discharge Diagnoses: Term Pregnancy-delivered  Discharge Information: Date: 04/01/2014 Activity: pelvic rest Diet: routine Medications: PNV, Ibuprofen and Iron Condition: stable Instructions: refer to practice specific booklet Discharge to: home Follow-up Information   Follow up with Lenoard AdenAAVON,RICHARD J, MD. Schedule an appointment as soon as possible for a visit in 6 weeks. (For postpartum visit)    Specialty:  Obstetrics and Gynecology   Contact information:   [address] Severe[address] [place] [id number] [phone]       Newborn Data: Live born female on 03/30/2014 Birth Weight: 7 lb 11.3 oz (3496 g) APGAR: 9, 9  Home with mother.  Raelyn MoraAWSON, Fawzi Melman, M MSN, CNM  04/01/2014, 8:50 AM

## 2014-07-14 ENCOUNTER — Encounter (HOSPITAL_COMMUNITY): Payer: Self-pay

## 2014-09-12 NOTE — L&D Delivery Note (Signed)
Delivery Note At 7:05 AM a viable and healthy female was delivered via Vaginal, Spontaneous Delivery (Presentation: Left Occiput Anterior).  APGAR: 8, 9; weight  pending.   Placenta status: Intact, Spontaneous.  Cord: 3 vessels with the following complications: None.  Cord pH: na  Anesthesia: Epidural  Episiotomy: None Lacerations: 2nd degree Suture Repair: 2.0 vicryl rapide Est. Blood Loss (mL): 100  Mom to postpartum.  Baby to Couplet care / Skin to Skin.  Cindy Bryant J 05/28/2015, 7:28 AM

## 2014-11-06 LAB — OB RESULTS CONSOLE ABO/RH: RH TYPE: POSITIVE

## 2014-11-06 LAB — OB RESULTS CONSOLE HEPATITIS B SURFACE ANTIGEN: HEP B S AG: NEGATIVE

## 2014-11-06 LAB — OB RESULTS CONSOLE ANTIBODY SCREEN: Antibody Screen: NEGATIVE

## 2014-11-06 LAB — OB RESULTS CONSOLE HIV ANTIBODY (ROUTINE TESTING): HIV: NONREACTIVE

## 2014-11-06 LAB — OB RESULTS CONSOLE RUBELLA ANTIBODY, IGM: Rubella: IMMUNE

## 2014-11-06 LAB — OB RESULTS CONSOLE RPR: RPR: NONREACTIVE

## 2014-12-22 ENCOUNTER — Inpatient Hospital Stay (HOSPITAL_COMMUNITY)
Admission: AD | Admit: 2014-12-22 | Discharge: 2014-12-22 | Disposition: A | Discharge status: Home / Self Care | Payer: Self-pay | Source: Ambulatory Visit | Attending: Obstetrics & Gynecology | Admitting: Obstetrics & Gynecology

## 2014-12-22 ENCOUNTER — Encounter (HOSPITAL_COMMUNITY): Payer: Self-pay | Admitting: *Deleted

## 2014-12-22 DIAGNOSIS — Z3A16 16 weeks gestation of pregnancy: Secondary | ICD-10-CM | POA: Insufficient documentation

## 2014-12-22 DIAGNOSIS — K5900 Constipation, unspecified: Secondary | ICD-10-CM

## 2014-12-22 DIAGNOSIS — O209 Hemorrhage in early pregnancy, unspecified: Secondary | ICD-10-CM | POA: Insufficient documentation

## 2014-12-22 DIAGNOSIS — O99612 Diseases of the digestive system complicating pregnancy, second trimester: Secondary | ICD-10-CM

## 2014-12-22 LAB — URINALYSIS, ROUTINE W REFLEX MICROSCOPIC
Bilirubin Urine: NEGATIVE
GLUCOSE, UA: NEGATIVE mg/dL
HGB URINE DIPSTICK: NEGATIVE
Ketones, ur: NEGATIVE mg/dL
Leukocytes, UA: NEGATIVE
Nitrite: NEGATIVE
PROTEIN: NEGATIVE mg/dL
Specific Gravity, Urine: <1.005 — ABNORMAL LOW (ref 1.005–1.030)
Urobilinogen, UA: 0.2 mg/dL (ref 0.0–1.0)
pH: 6.5 (ref 5.0–8.0)

## 2014-12-22 MED ORDER — MAGNESIUM 200 MG PO TABS
200.0000 mg | ORAL_TABLET | Freq: Every day | ORAL | Status: DC
Start: 2014-12-22 — End: 2015-05-28

## 2014-12-22 NOTE — MAU Note (Signed)
PT  SAYS  SHE WENT TO OFFICE  THIS AM -  ALL OK  WITH  DR TAAVON.  THEN AT 5PM  SHE WENT  TO B-ROOM - SAW  RED  BLOOD IN TOILET  .  SO WHEN  ARRIVED- SHE SAW  SPOTTING  WHEN  SHE WIPED.   NO EXAM IN OFFICE.   NO SEX TODAY.   SAYS SHE FELT CRAMPING  YESTERDAY  AND ALSO SHARP BACK PAIN.  TOLD DR Billy CoastAAVON ABOUT  CRAMPING.    IN ROOM 7-  NO BLEEDING.

## 2014-12-22 NOTE — MAU Note (Signed)
Pt reports went to the restroom at about 5pm and had some bleeding, since then it has been spotting. No pain today but yesterday she had lower back and lower abd pain.
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
## 2015-03-04 IMAGING — US US OB COMP LESS 14 WK
1 series · 14 of 28 positions shown · non-contrast
Comparison: None.

CLINICAL DATA: Vaginal bleeding. Seven weeks and 4 days pregnant by
last menstrual period.

EXAM:
OBSTETRIC <14 WK US AND TRANSVAGINAL OB US
TECHNIQUE: Both transabdominal and transvaginal ultrasound examinations were
performed for complete evaluation of the gestation as well as the
maternal uterus, adnexal regions, and pelvic cul-de-sac.
Transvaginal technique was performed to assess early pregnancy.

[Series 1: us ob comp less 14 wks · 43 acquisitions, 14 frames shown]
[im 2/43]
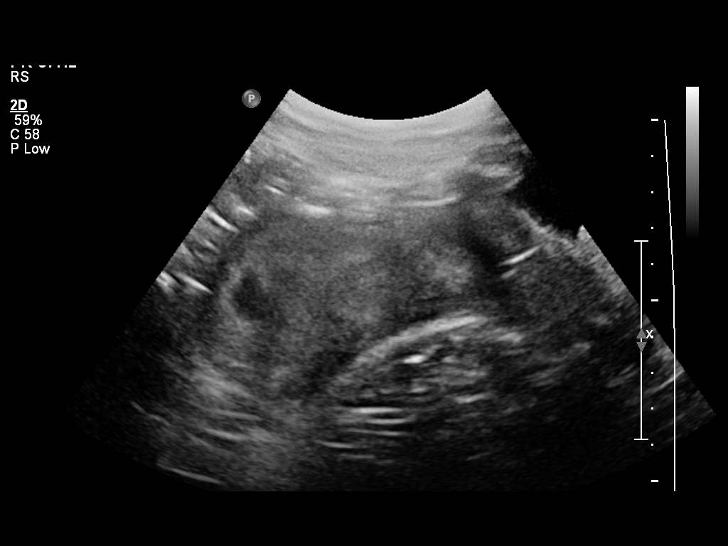
[im 5/43]
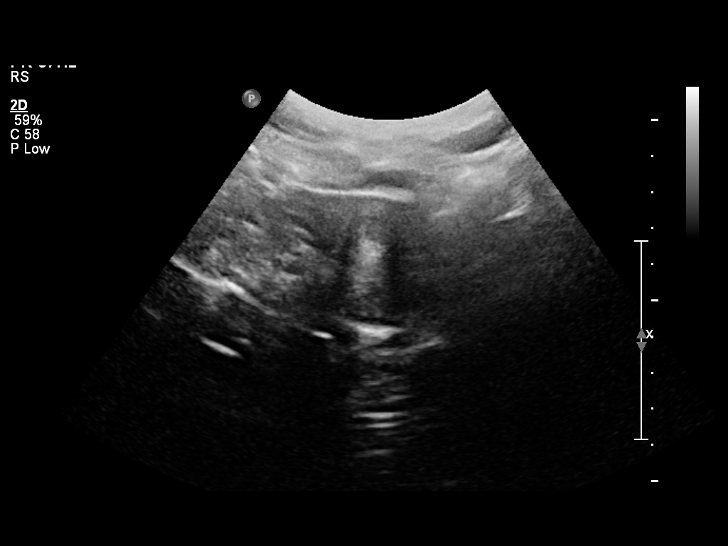
[im 8/43]
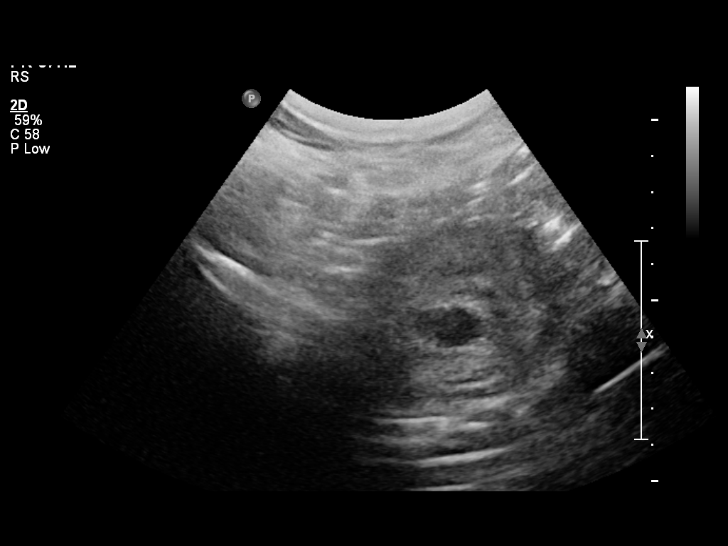
[im 11/43]
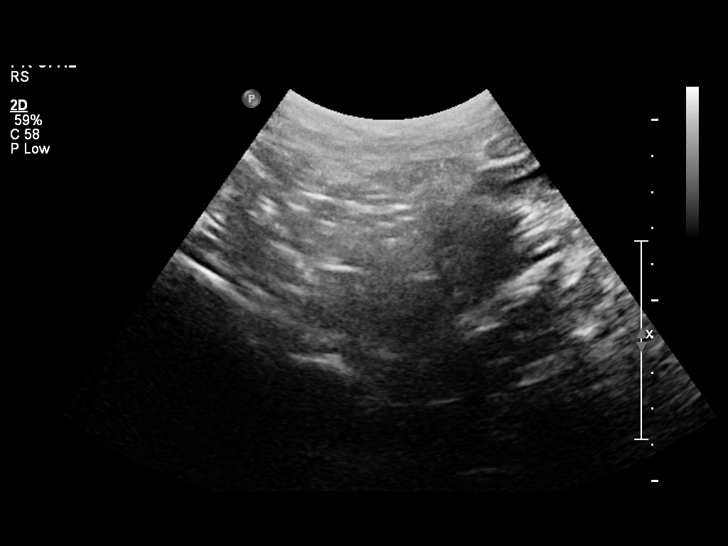
[im 15/43]
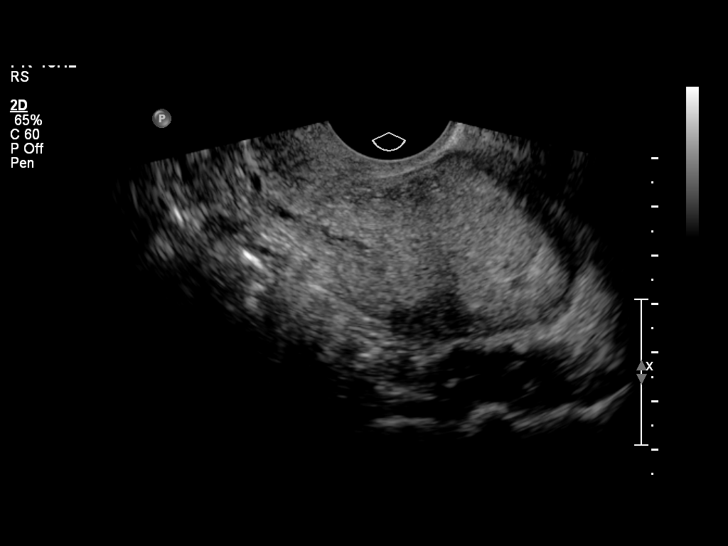
[im 18/43]
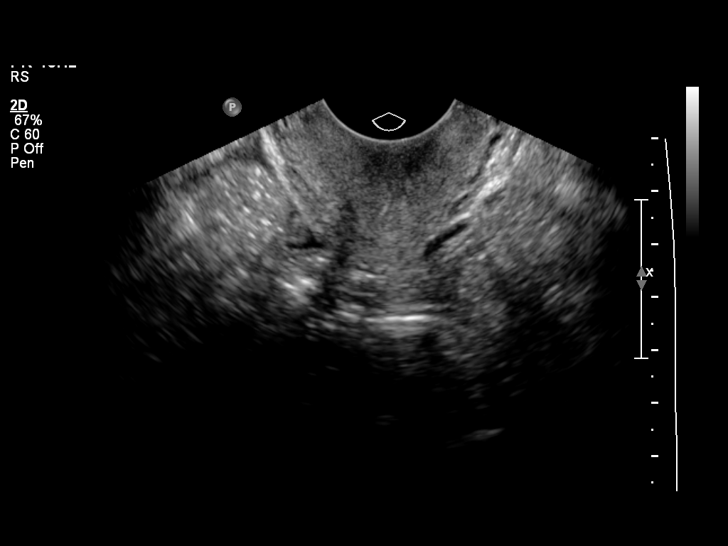
[im 21/43]
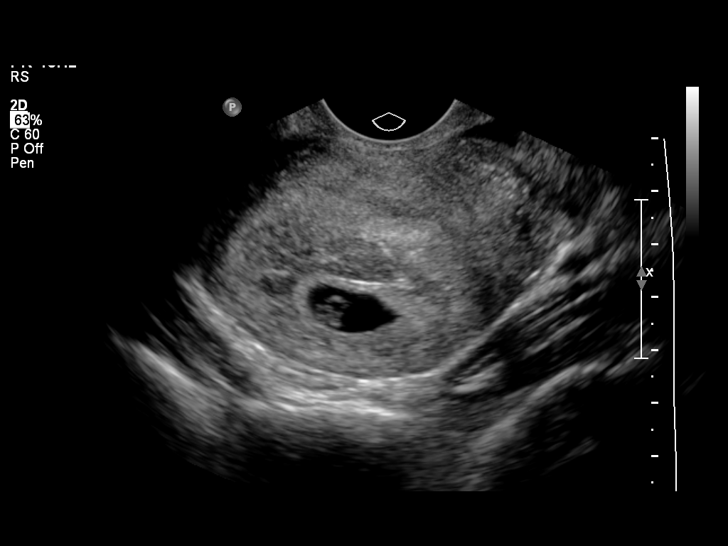
[im 24/43]
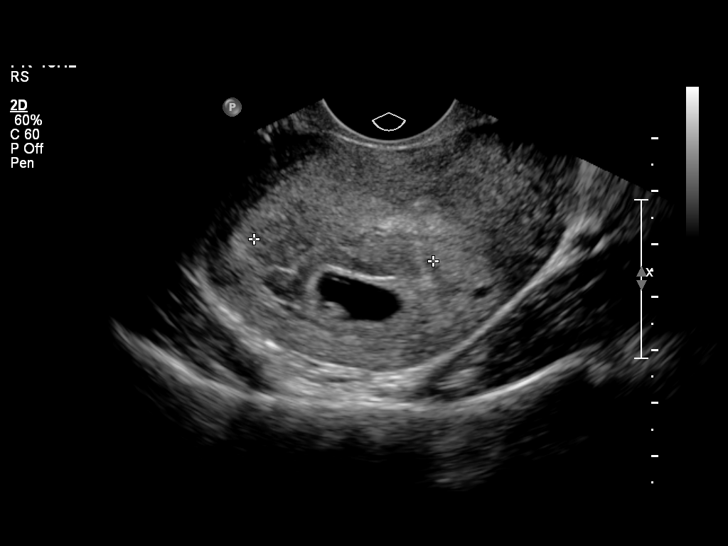
[im 27/43]
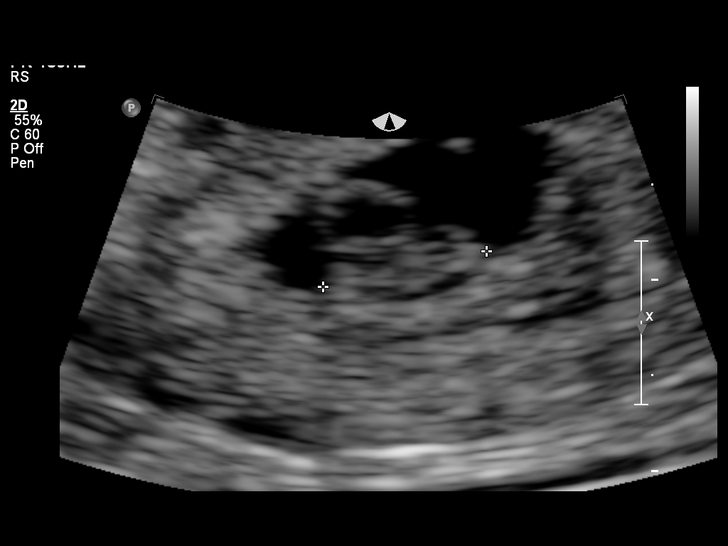
[im 30/43]
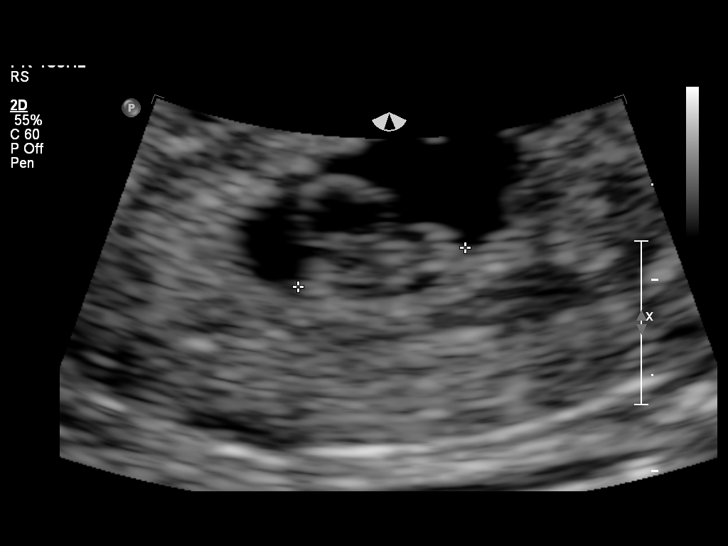
[im 33/43]
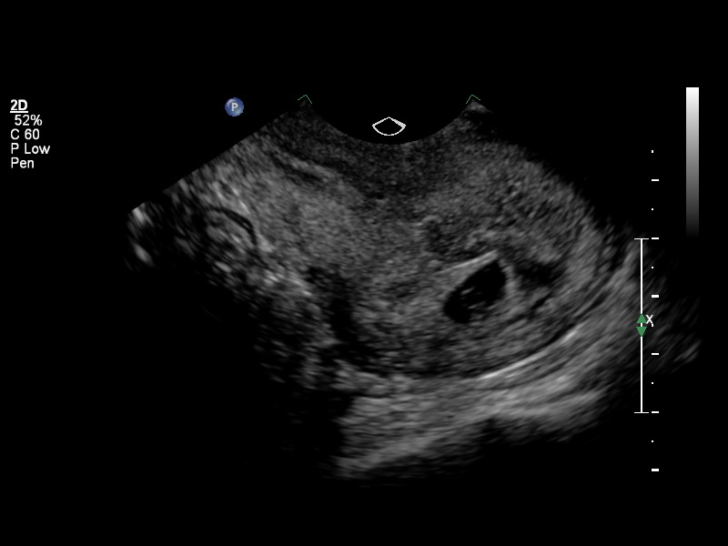
[im 36/43]
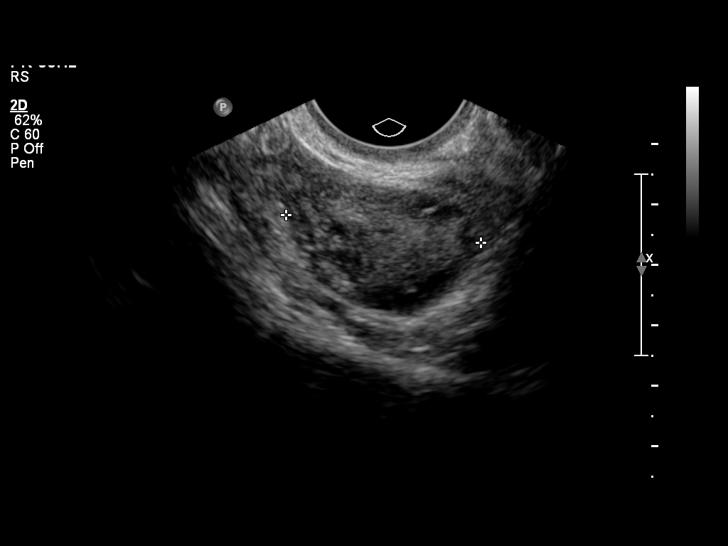
[im 39/43]
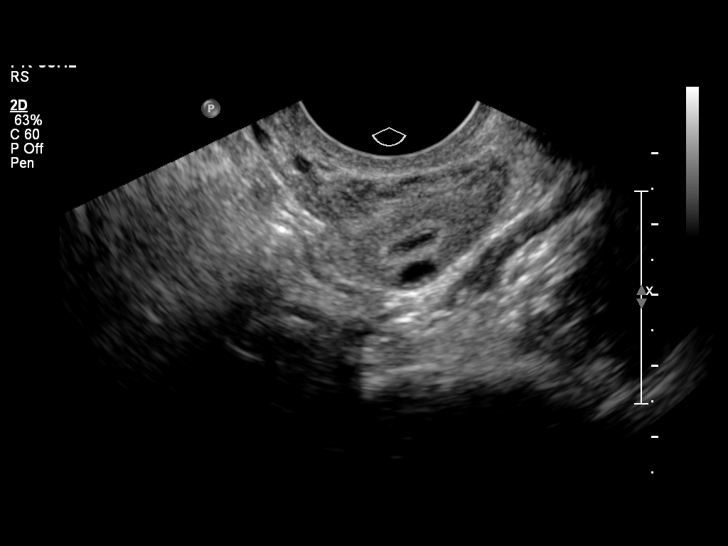
[im 43/43]
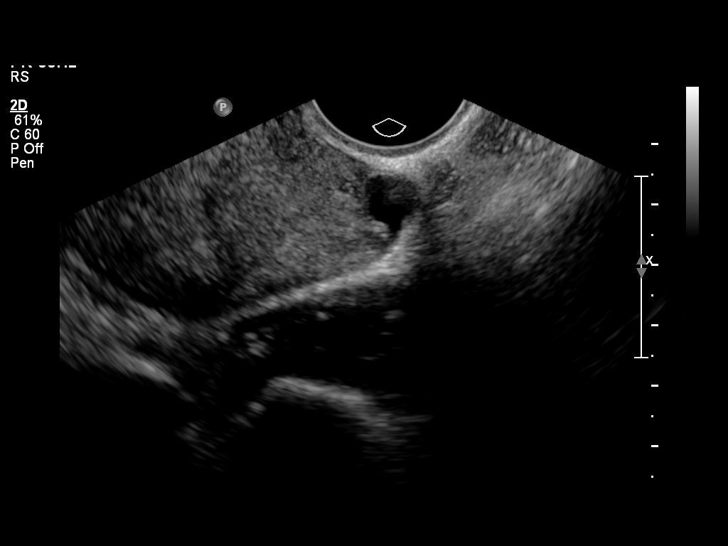

[14 of 28 positions shown; findings below may reference images not displayed]

FINDINGS: Intrauterine gestational sac: Visualized/normal in shape.

Yolk sac:  Visualized/ normal in appearance

Embryo:  Visualized

Cardiac Activity: Visualized

Heart Rate:  126 bpm

CRL:   8.9  mm   7 w 0 d                  US EDC: 04/06/2014

Maternal uterus/adnexae: Large subchorionic hemorrhage. Normal
appearing maternal ovaries with a corpus luteum noted on the right.
Trace amount of free peritoneal fluid, within normal limits of
physiological fluid.
IMPRESSION: 1. Single living intrauterine gestation with an estimated
gestational age of 7 weeks and 0 days.
2. Large subchorionic hemorrhage.

## 2015-05-04 LAB — OB RESULTS CONSOLE GC/CHLAMYDIA
Chlamydia: NEGATIVE
Gonorrhea: NEGATIVE

## 2015-05-04 LAB — OB RESULTS CONSOLE GBS: GBS: NEGATIVE

## 2015-05-27 ENCOUNTER — Encounter (HOSPITAL_COMMUNITY): Payer: Self-pay

## 2015-05-27 ENCOUNTER — Inpatient Hospital Stay (HOSPITAL_COMMUNITY)
Admission: AD | Admit: 2015-05-27 | Discharge: 2015-05-27 | Disposition: A | Discharge status: Home / Self Care | Payer: Self-pay | Source: Ambulatory Visit | Attending: Obstetrics | Admitting: Obstetrics

## 2015-05-27 DIAGNOSIS — O471 False labor at or after 37 completed weeks of gestation: Secondary | ICD-10-CM | POA: Insufficient documentation

## 2015-05-27 DIAGNOSIS — Z3A38 38 weeks gestation of pregnancy: Secondary | ICD-10-CM | POA: Insufficient documentation

## 2015-05-27 NOTE — Progress Notes (Signed)
Received orders to discharge pt.

## 2015-05-27 NOTE — Discharge Instructions (Signed)

## 2015-05-27 NOTE — Progress Notes (Signed)
NST review  Indication: False labor at > 37 wks, rectal pressure  NST: 140's, + accels, no decels, 10 beat variability Toco: q 6-8 min  A/P: Reactive fetal testing, not in labor  Kamarius Buckbee A. 05/27/2015 8:52 PM

## 2015-05-27 NOTE — MAU Note (Signed)
Contractions started about 1 1/2 hours ago about 5 minutes apart, no vaginal bleeding, no LOF, positive FM, denies problems during pregnancy.

## 2015-05-27 NOTE — MAU Note (Signed)
Paged Dr. Billy Coast

## 2015-05-28 ENCOUNTER — Inpatient Hospital Stay (HOSPITAL_COMMUNITY): Payer: BLUE CROSS/BLUE SHIELD | Admitting: Anesthesiology

## 2015-05-28 ENCOUNTER — Inpatient Hospital Stay (HOSPITAL_COMMUNITY)
Admission: AD | Admit: 2015-05-28 | Discharge: 2015-05-29 | DRG: 774 | Disposition: A | Discharge status: Home / Self Care | Payer: BLUE CROSS/BLUE SHIELD | Source: Ambulatory Visit | Attending: Obstetrics and Gynecology | Admitting: Obstetrics and Gynecology

## 2015-05-28 ENCOUNTER — Encounter (HOSPITAL_COMMUNITY): Payer: Self-pay

## 2015-05-28 DIAGNOSIS — Z3A39 39 weeks gestation of pregnancy: Secondary | ICD-10-CM | POA: Diagnosis present

## 2015-05-28 DIAGNOSIS — Z8249 Family history of ischemic heart disease and other diseases of the circulatory system: Secondary | ICD-10-CM | POA: Diagnosis not present

## 2015-05-28 DIAGNOSIS — O358XX0 Maternal care for other (suspected) fetal abnormality and damage, not applicable or unspecified: Secondary | ICD-10-CM | POA: Diagnosis present

## 2015-05-28 LAB — CBC
HCT: 40 % (ref 36.0–46.0)
HEMATOCRIT: 32.7 % — AB (ref 36.0–46.0)
HEMOGLOBIN: 11.1 g/dL — AB (ref 12.0–15.0)
Hemoglobin: 13.9 g/dL (ref 12.0–15.0)
MCH: 30.7 pg (ref 26.0–34.0)
MCH: 30.1 pg (ref 26.0–34.0)
MCHC: 34.8 g/dL (ref 30.0–36.0)
MCHC: 33.9 g/dL (ref 30.0–36.0)
MCV: 88.3 fL (ref 78.0–100.0)
MCV: 88.6 fL (ref 78.0–100.0)
PLATELETS: 172 10*3/uL (ref 150–400)
Platelets: 157 10*3/uL (ref 150–400)
RBC: 4.53 MIL/uL (ref 3.87–5.11)
RBC: 3.69 MIL/uL — AB (ref 3.87–5.11)
RDW: 14.4 % (ref 11.5–15.5)
RDW: 14.3 % (ref 11.5–15.5)
WBC: 9 10*3/uL (ref 4.0–10.5)
WBC: 12.2 10*3/uL — ABNORMAL HIGH (ref 4.0–10.5)

## 2015-05-28 LAB — RPR: RPR: NONREACTIVE

## 2015-05-28 LAB — TYPE AND SCREEN
ABO/RH(D): B POS
Antibody Screen: NEGATIVE

## 2015-05-28 MED ORDER — WITCH HAZEL-GLYCERIN EX PADS: 1.0000 "application " | MEDICATED_PAD | CUTANEOUS | Status: DC | PRN

## 2015-05-28 MED ORDER — FENTANYL 2.5 MCG/ML BUPIVACAINE 1/10 % EPIDURAL INFUSION (WH - ANES): 14.0000 mL/h | INTRAMUSCULAR | Status: DC | PRN

## 2015-05-28 MED ORDER — ACETAMINOPHEN 325 MG PO TABS: 650.0000 mg | ORAL_TABLET | ORAL | Status: DC | PRN

## 2015-05-28 MED ORDER — SENNOSIDES-DOCUSATE SODIUM 8.6-50 MG PO TABS
2.0000 | ORAL_TABLET | ORAL | Status: DC
  Administered 2015-05-28: 2 via ORAL
  Filled 2015-05-28: qty 2

## 2015-05-28 MED ORDER — DIPHENHYDRAMINE HCL 50 MG/ML IJ SOLN: 12.5000 mg | INTRAMUSCULAR | Status: DC | PRN

## 2015-05-28 MED ORDER — DIPHENHYDRAMINE HCL 25 MG PO CAPS: 25.0000 mg | ORAL_CAPSULE | Freq: Four times a day (QID) | ORAL | Status: DC | PRN

## 2015-05-28 MED ORDER — ZOLPIDEM TARTRATE 5 MG PO TABS: 5.0000 mg | ORAL_TABLET | Freq: Every evening | ORAL | Status: DC | PRN

## 2015-05-28 MED ORDER — OXYCODONE-ACETAMINOPHEN 5-325 MG PO TABS: 2.0000 | ORAL_TABLET | ORAL | Status: DC | PRN

## 2015-05-28 MED ORDER — LACTATED RINGERS IV SOLN
INTRAVENOUS | Status: DC
  Administered 2015-05-28: 06:00:00 via INTRAVENOUS

## 2015-05-28 MED ORDER — FENTANYL 2.5 MCG/ML BUPIVACAINE 1/10 % EPIDURAL INFUSION (WH - ANES)
14.0000 mL/h | INTRAMUSCULAR | Status: DC | PRN
  Administered 2015-05-28: 14 mL/h via EPIDURAL
  Filled 2015-05-28: qty 125

## 2015-05-28 MED ORDER — LIDOCAINE HCL (PF) 1 % IJ SOLN
30.0000 mL | INTRAMUSCULAR | Status: DC | PRN
  Filled 2015-05-28: qty 30

## 2015-05-28 MED ORDER — OXYTOCIN 40 UNITS IN LACTATED RINGERS INFUSION - SIMPLE MED
62.5000 mL/h | INTRAVENOUS | Status: DC
  Filled 2015-05-28: qty 1000

## 2015-05-28 MED ORDER — FLEET ENEMA 7-19 GM/118ML RE ENEM: 1.0000 | ENEMA | RECTAL | Status: DC | PRN

## 2015-05-28 MED ORDER — LACTATED RINGERS IV SOLN
500.0000 mL | INTRAVENOUS | Status: DC | PRN
  Administered 2015-05-28: 500 mL via INTRAVENOUS

## 2015-05-28 MED ORDER — ONDANSETRON HCL 4 MG/2ML IJ SOLN: 4.0000 mg | INTRAMUSCULAR | Status: DC | PRN

## 2015-05-28 MED ORDER — LANOLIN HYDROUS EX OINT: TOPICAL_OINTMENT | CUTANEOUS | Status: DC | PRN

## 2015-05-28 MED ORDER — PHENYLEPHRINE 40 MCG/ML (10ML) SYRINGE FOR IV PUSH (FOR BLOOD PRESSURE SUPPORT)
80.0000 ug | PREFILLED_SYRINGE | INTRAVENOUS | Status: DC | PRN
  Filled 2015-05-28: qty 20
  Filled 2015-05-28: qty 2

## 2015-05-28 MED ORDER — TETANUS-DIPHTH-ACELL PERTUSSIS 5-2.5-18.5 LF-MCG/0.5 IM SUSP: 0.5000 mL | Freq: Once | INTRAMUSCULAR | Status: DC

## 2015-05-28 MED ORDER — CITRIC ACID-SODIUM CITRATE 334-500 MG/5ML PO SOLN: 30.0000 mL | ORAL | Status: DC | PRN

## 2015-05-28 MED ORDER — OXYCODONE-ACETAMINOPHEN 5-325 MG PO TABS
2.0000 | ORAL_TABLET | ORAL | Status: DC | PRN
Start: 2015-05-28 — End: 2015-05-29

## 2015-05-28 MED ORDER — ONDANSETRON HCL 4 MG/2ML IJ SOLN: 4.0000 mg | Freq: Four times a day (QID) | INTRAMUSCULAR | Status: DC | PRN

## 2015-05-28 MED ORDER — BENZOCAINE-MENTHOL 20-0.5 % EX AERO
1.0000 "application " | INHALATION_SPRAY | CUTANEOUS | Status: DC | PRN
  Administered 2015-05-28: 1 via TOPICAL
  Filled 2015-05-28: qty 56

## 2015-05-28 MED ORDER — METHYLERGONOVINE MALEATE 0.2 MG/ML IJ SOLN: 0.2000 mg | INTRAMUSCULAR | Status: AC | PRN

## 2015-05-28 MED ORDER — OXYTOCIN BOLUS FROM INFUSION
500.0000 mL | INTRAVENOUS | Status: DC
  Administered 2015-05-28: 500 mL via INTRAVENOUS

## 2015-05-28 MED ORDER — LIDOCAINE HCL (PF) 1 % IJ SOLN
INTRAMUSCULAR | Status: DC | PRN
  Administered 2015-05-28: 4 mL
  Administered 2015-05-28: 6 mL via EPIDURAL

## 2015-05-28 MED ORDER — IBUPROFEN 600 MG PO TABS
600.0000 mg | ORAL_TABLET | Freq: Four times a day (QID) | ORAL | Status: DC
  Administered 2015-05-28 – 2015-05-29 (×4): 600 mg via ORAL
  Filled 2015-05-28 (×4): qty 1

## 2015-05-28 MED ORDER — METHYLERGONOVINE MALEATE 0.2 MG PO TABS
0.2000 mg | ORAL_TABLET | ORAL | Status: AC | PRN
  Administered 2015-05-28 – 2015-05-29 (×6): 0.2 mg via ORAL
  Filled 2015-05-28 (×6): qty 1

## 2015-05-28 MED ORDER — EPHEDRINE 5 MG/ML INJ
10.0000 mg | INTRAVENOUS | Status: DC | PRN
  Filled 2015-05-28: qty 2

## 2015-05-28 MED ORDER — SIMETHICONE 80 MG PO CHEW: 80.0000 mg | CHEWABLE_TABLET | ORAL | Status: DC | PRN

## 2015-05-28 MED ORDER — OXYCODONE-ACETAMINOPHEN 5-325 MG PO TABS: 1.0000 | ORAL_TABLET | ORAL | Status: DC | PRN

## 2015-05-28 MED ORDER — PRENATAL MULTIVITAMIN CH
1.0000 | ORAL_TABLET | Freq: Every day | ORAL | Status: DC
  Administered 2015-05-28: 1 via ORAL
  Filled 2015-05-28: qty 1

## 2015-05-28 MED ORDER — ONDANSETRON HCL 4 MG PO TABS: 4.0000 mg | ORAL_TABLET | ORAL | Status: DC | PRN

## 2015-05-28 MED ORDER — DIBUCAINE 1 % RE OINT: 1.0000 "application " | TOPICAL_OINTMENT | RECTAL | Status: DC | PRN

## 2015-05-28 NOTE — Progress Notes (Signed)
Dr. Billy Coast notified of EBL during recovery of 850 ML for a total of 950 ml. Pt hemodynamically stable, VSS. Will cont to monitor. FF U/-2

## 2015-05-28 NOTE — Anesthesia Preprocedure Evaluation (Signed)

## 2015-05-28 NOTE — Anesthesia Procedure Notes (Signed)
Epidural Patient location during procedure: OB  Preanesthetic Checklist Completed: patient identified, site marked, surgical consent, pre-op evaluation, timeout performed, IV checked, risks and benefits discussed and monitors and equipment checked  Epidural Patient position: sitting Prep: site prepped and draped and DuraPrep Patient monitoring: continuous pulse ox and blood pressure Approach: midline Injection technique: LOR air  Needle:  Needle type: Tuohy  Needle gauge: 17 G Needle length: 9 cm and 9 Needle insertion depth: 4 cm Catheter type: closed end flexible Catheter size: 19 Gauge Catheter at skin depth: 10 cm Test dose: negative  Assessment Events: blood not aspirated, injection not painful, no injection resistance, negative IV test and no paresthesia  Additional Notes Dosing of Epidural:  1st dose, through catheter .............................................  Xylocaine 40 mg  2nd dose, through catheter, after waiting 3 minutes.........Xylocaine 60 mg    As each dose occurred, patient was free of IV sx; and patient exhibited no evidence of SA injection.  Patient is more comfortable after epidural dosed. Please see RN's note for documentation of vital signs,and FHR which are stable.  Patient reminded not to try to ambulate with numb legs, and that an RN must be present when she attempts to get up.       
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
## 2015-05-28 NOTE — Progress Notes (Signed)
Patient ID: Cindy Bryant, female   DOB: 05/22/1991, 24 y.o.   MRN: [id number] INTERVAL NOTE: PPD #0 SVD  S:   Resting in bed, min cramping, (+) voids, small bleed, denies HA/NV/dizziness  O:   VSS, AAO x 3, NAD  2 FB below U  Scant lochia  A / P:   PPD #0  PPH (EBL 950 mL)  Stable post partum  Routine PP orders  Start Methergine series per Dr. Jorene Minors order  Kenard Gower, MSN, CNM 05/28/2015, 11:15 AM
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
## 2015-05-28 NOTE — MAU Note (Signed)
Pt states more pressure and more contractions.

## 2015-05-29 LAB — CBC
HEMATOCRIT: 33.6 % — AB (ref 36.0–46.0)
HEMOGLOBIN: 11.3 g/dL — AB (ref 12.0–15.0)
MCH: 30 pg (ref 26.0–34.0)
MCHC: 33.6 g/dL (ref 30.0–36.0)
MCV: 89.1 fL (ref 78.0–100.0)
Platelets: 155 10*3/uL (ref 150–400)
RBC: 3.77 MIL/uL — AB (ref 3.87–5.11)
RDW: 14.5 % (ref 11.5–15.5)
WBC: 11.2 10*3/uL — AB (ref 4.0–10.5)

## 2015-05-29 MED ORDER — OXYCODONE-ACETAMINOPHEN 5-325 MG PO TABS: 1.0000 | ORAL_TABLET | ORAL | Status: AC | PRN

## 2015-05-29 MED ORDER — IBUPROFEN 600 MG PO TABS: 600.0000 mg | ORAL_TABLET | Freq: Four times a day (QID) | ORAL | Status: AC

## 2015-05-29 NOTE — Anesthesia Postprocedure Evaluation (Signed)
Anesthesia Post Note  Patient: Cindy Bryant  Procedure(s) Performed: * No procedures listed *  Anesthesia type: Epidural  Patient location: Mother/Baby  Post pain: Pain level controlled  Post assessment: Post-op Vital signs reviewed  Last Vitals:  Filed Vitals:   05/29/15 0600  BP: 119/79  Pulse: 94  Temp: 36.7 C  Resp: 18    Post vital signs: Reviewed  Level of consciousness:alert  Complications: No apparent anesthesia complications

## 2015-05-29 NOTE — Discharge Summary (Signed)
Obstetric Discharge Summary  Reason for Admission: onset of labor Prenatal Procedures: none Intrapartum Procedures: spontaneous vaginal delivery Postpartum Procedures: none Complications-Operative and Postpartum: 2nd degree perineal laceration HEMOGLOBIN  Date Value Ref Range Status  05/29/2015 11.3* 12.0 - 15.0 g/dL Final   HCT  Date Value Ref Range Status  05/29/2015 33.6* 36.0 - 46.0 % Final    Physical Exam:  General: alert, cooperative and no distress Lochia: appropriate Uterine Fundus: firm Incision: healing well DVT Evaluation: No evidence of DVT seen on physical exam.  Discharge Diagnoses: Term Pregnancy-delivered  Discharge Information: Date: 05/29/2015 Activity: pelvic rest Diet: routine Medications: PNV and Ibuprofen Condition: stable Instructions: refer to practice specific booklet Discharge to: home Follow-up Information    Follow up with Lenoard Aden, MD. Schedule an appointment as soon as possible for a visit in 6 weeks.   Specialty:  Obstetrics and Gynecology   Contact information:   [address] [place] [address] [phone]       Newborn Data: Live born female  Birth Weight: 7 lb 11.5 oz (3500 g) APGAR: 9, 9  Home with mother.  Cindy Bryant 05/29/2015, 9:26 AM

## 2015-05-29 NOTE — Lactation Note (Signed)
This note was copied from the chart of Cindy Bryant. Lactation Consultation Note  Mom d/c prior to [facility] visit.  Patient Name: Cindy Shallen Luedke [id number] Date: 05/29/2015     Maternal Data    Feeding    LATCH Score/Interventions                      Lactation Tools Discussed/Used     Consult Status      Ed Blalock 05/29/2015, 11:59 AM

## 2015-05-29 NOTE — Progress Notes (Signed)
PPD 1 SVD  S:  Reports feeling well - wants to go home             Tolerating po/ No nausea or vomiting             Bleeding is light             Pain controlled with motrin             Up ad lib / ambulatory / voiding QS  Newborn breast feeding  / Circumcision planned O:               VS: BP 119/79 mmHg  Pulse 94  Temp(Src) 98 F (36.7 C) (Oral)  Resp 18  Ht  (1.702 m)  Wt 57.153 kg (126 lb)  BMI 19.73 kg/m2  SpO2 98%  LMP 06/26/2013  Breastfeeding? Unknown   LABS:              Recent Labs  05/28/15 1035 05/29/15 0542  WBC 12.2* 11.2*  HGB 11.1* 11.3*  PLT 157 155               Blood type: --/--/B POS (09/15 0555)  Rubella: Immune (02/25 0000)                     I&O: Intake/Output      09/15 0701 - 09/16 0700 09/16 0701 - 09/17 0700   Urine (mL/kg/hr) 0 (0)    Blood 950 (0.7)    Total Output 950     Net -950          Urine Occurrence 2 x                  Physical Exam:             Alert and oriented X3  Abdomen: soft, non-tender, non-distended              Fundus: firm, non-tender, U-1  Perineum: mild edema  Lochia: light  Extremities: no edema, no calf pain or tenderness    A: PPD # 1   Doing well - stable status  P: Routine post partum orders  DC home  Marlinda Mike CNM, MSN, [facility] 05/29/2015, 9:21 AM

## 2015-06-04 ENCOUNTER — Inpatient Hospital Stay (HOSPITAL_COMMUNITY): Admission: RE | Admit: 2015-06-04 | Payer: BLUE CROSS/BLUE SHIELD | Source: Ambulatory Visit

## 2018-02-23 NOTE — Telephone Encounter (Signed)
Preadmission screen
